# Patient Record
Sex: Female | Born: 1955 | Race: White | Hispanic: No | Marital: Married | Smoking: Current every day smoker
Health system: Southern US, Community
[De-identification: ages and names within clinical notes are randomized; demographics above are authoritative.]

## PROBLEM LIST (undated history)

## (undated) DIAGNOSIS — R238 Other skin changes: Secondary | ICD-10-CM

## (undated) DIAGNOSIS — M810 Age-related osteoporosis without current pathological fracture: Secondary | ICD-10-CM

## (undated) DIAGNOSIS — F319 Bipolar disorder, unspecified: Secondary | ICD-10-CM

## (undated) DIAGNOSIS — R233 Spontaneous ecchymoses: Secondary | ICD-10-CM

## (undated) DIAGNOSIS — J189 Pneumonia, unspecified organism: Secondary | ICD-10-CM

## (undated) DIAGNOSIS — F32A Depression, unspecified: Secondary | ICD-10-CM

## (undated) DIAGNOSIS — F329 Major depressive disorder, single episode, unspecified: Secondary | ICD-10-CM

## (undated) DIAGNOSIS — E039 Hypothyroidism, unspecified: Secondary | ICD-10-CM

## (undated) DIAGNOSIS — M1990 Unspecified osteoarthritis, unspecified site: Secondary | ICD-10-CM

## (undated) DIAGNOSIS — G43909 Migraine, unspecified, not intractable, without status migrainosus: Secondary | ICD-10-CM

## (undated) DIAGNOSIS — K219 Gastro-esophageal reflux disease without esophagitis: Secondary | ICD-10-CM

## (undated) HISTORY — PX: DILATION AND CURETTAGE OF UTERUS: SHX78

## (undated) HISTORY — PX: COLONOSCOPY: SHX174

## (undated) HISTORY — PX: ORIF ANKLE FRACTURE: SUR919

---
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
## 1980-06-22 HISTORY — PX: KNEE ARTHROSCOPY: SUR90

## 1983-06-23 HISTORY — PX: CARPAL TUNNEL RELEASE: SHX101

## 1990-06-22 DIAGNOSIS — J189 Pneumonia, unspecified organism: Secondary | ICD-10-CM

## 1990-06-22 HISTORY — DX: Pneumonia, unspecified organism: J18.9

## 1997-10-31 ENCOUNTER — Inpatient Hospital Stay (HOSPITAL_COMMUNITY): Admission: EM | Admit: 1997-10-31 | Discharge: 1997-11-06 | Payer: Self-pay | Admitting: Emergency Medicine

## 2002-08-18 ENCOUNTER — Encounter: Payer: Self-pay | Admitting: *Deleted

## 2002-08-18 ENCOUNTER — Encounter: Payer: Self-pay | Admitting: Orthopedic Surgery

## 2002-08-18 ENCOUNTER — Emergency Department (HOSPITAL_COMMUNITY): Admission: EM | Admit: 2002-08-18 | Discharge: 2002-08-18 | Payer: Self-pay | Admitting: *Deleted

## 2002-08-19 ENCOUNTER — Encounter: Payer: Self-pay | Admitting: Orthopedic Surgery

## 2002-08-19 ENCOUNTER — Inpatient Hospital Stay (HOSPITAL_COMMUNITY): Admission: RE | Admit: 2002-08-19 | Discharge: 2002-08-20 | Payer: Self-pay | Admitting: Orthopedic Surgery

## 2004-06-06 ENCOUNTER — Ambulatory Visit: Payer: Self-pay | Admitting: Cardiology

## 2009-09-20 ENCOUNTER — Ambulatory Visit (HOSPITAL_COMMUNITY): Admission: RE | Admit: 2009-09-20 | Discharge: 2009-09-20 | Payer: Self-pay | Admitting: Orthopedic Surgery

## 2009-09-20 ENCOUNTER — Ambulatory Visit: Payer: Self-pay | Admitting: Vascular Surgery

## 2009-09-20 ENCOUNTER — Encounter (INDEPENDENT_AMBULATORY_CARE_PROVIDER_SITE_OTHER): Payer: Self-pay | Admitting: Orthopedic Surgery

## 2010-11-07 NOTE — Op Note (Signed)
NAME:  [name], [name]                            ACCOUNT NO.:  [id number]   MEDICAL RECORD NO.:  [id number]                   PATIENT TYPE:  INP   LOCATION:  0101                                 FACILITY:  [facility]   PHYSICIAN:  Marlowe Kays, M.D.               DATE OF BIRTH:  [birth year]   DATE OF PROCEDURE:  08/19/2002  DATE OF DISCHARGE:                                 OPERATIVE REPORT   PREOPERATIVE DIAGNOSES:  Closed displaced posterior malleolar fracture.  Bimalleolar fracture, left ankle.   POSTOPERATIVE DIAGNOSES:  Closed displaced posterior malleolar fracture.  Bimalleolar fracture, left ankle.   OPERATION:  Open reduction internal fixation, posterior malleolar fracture -  left ankle.   SURGEON:  Marlowe Kays, M.D.   ASSISTANT:  Clarene Reamer, P.A.-C.   ANESTHESIA:  General.   INDICATIONS FOR PROCEDURE:  Injury was two nights ago and she was seen here  in the emergency room early yesterday morning, and in my office yesterday  afternoon.  She had spiral fracture of the distal fibula, which was in  satisfactory position; but, the large posterior malleolar fracture  comprising about 3/7 of the articular surface of the joint, with about 3 mm  of displacement and it was not felt that this was satisfactory.  The plan  today was to try to close manipulation, which I had told the patient and her  husband would most likely not be successful because of the nature of the  fracture, with probable ORIF.   DESCRIPTION OF PROCEDURE:  Under satisfactory general anesthesia, she was  placed in the prone position initially and with dorsiflexion of the ankle  inversion, I manipulated the fracture.  Using a C-arm found out that the  fracture was still significantly displaced.  Accordingly, we then prepared  her for ORIF.  She is given appropriate prophylactic antibiotics.  A  pneumatic tourniquet was applied.  She was then placed in the prone position  on rolls.  The leg was wrapped  in Esmarch nonsterilely, and then prepped  with Duraprep from the mid calf to the toes; draped in a sterile field.  We  made an incision just medial to the Achilles' tendon, identified the  paracalcaneal joint.  Moving proximally, the tibial talar joint was  identified; then continued dissection proximally until the fracture was  identified.  Because of the height of the fracture, I was not able to really  see the fracture line; but, in spite we tractioned and accordingly I used a  periosteal elevator to pull down the posterior malleolar fragment as close  as I could get it to the talus.  I then placed a smooth Steinmann pin,  stabilizing it into the main portion of the tibia.  C-arm x-rays appeared to  demonstrate reconstitution of the joint nicely, with the fracture to be in  good position.  Accordingly, I placed two guide pins for cannulated  screws  above and below the stabilizing pin, and checked their position on x-ray;  measured them and used 34 and 32 mm cannulated 4.5 screws.  X-rays again  appeared to indicate that we had anatomically reduced the ankle mortis and  the fracture.  I then removed the Steinmann pin, placed a guide pin in the  hole, and used a third 4.5 cannulated fully-threaded screw.  Final pictures  were taken, both in the lateral and in the posterior anterior positions.  The three screws appeared to be in good position, the fracture and mortis  reduced, and the distal fibula fracture appeared also to be in satisfactory  position (although this was an AP x-ray).   I then irrigated the wound well with sterile saline, infiltrated the soft  tissues with 0.5% plain Marcaine, and closed the wound with interrupted 2-0  Vicryl in the posterior ankle capsule and subcutaneous tissue.  Used  interrupted 4-0  nylon in the skin and subcutaneous tissue.  Betadine and Adaptic dry sterile  dressing, and a short-leg splint cast were applied.  The tourniquet was  released.  She  tolerated the procedure well and was taken to the recovery  room in satisfactory condition with normal complications.  Permanent AP and  lateral x-rays will be taken in the PACU.                                               Marlowe Kays, M.D.    JA/MEDQ  D:  08/19/2002  T:  08/19/2002  Job:  [id number]

## 2013-03-16 ENCOUNTER — Encounter (HOSPITAL_COMMUNITY): Payer: Self-pay | Admitting: Pharmacy Technician

## 2013-03-17 ENCOUNTER — Encounter (HOSPITAL_COMMUNITY)
Admission: RE | Admit: 2013-03-17 | Discharge: 2013-03-17 | Disposition: A | Discharge status: Home / Self Care | Payer: 59 | Source: Ambulatory Visit | Attending: Orthopaedic Surgery | Admitting: Orthopaedic Surgery

## 2013-03-17 ENCOUNTER — Encounter (HOSPITAL_COMMUNITY): Payer: Self-pay

## 2013-03-17 ENCOUNTER — Encounter (HOSPITAL_COMMUNITY)
Admission: RE | Admit: 2013-03-17 | Discharge: 2013-03-17 | Disposition: A | Discharge status: Home / Self Care | Payer: 59 | Source: Ambulatory Visit | Attending: Orthopedic Surgery | Admitting: Orthopedic Surgery

## 2013-03-17 DIAGNOSIS — Z01818 Encounter for other preprocedural examination: Secondary | ICD-10-CM | POA: Insufficient documentation

## 2013-03-17 DIAGNOSIS — Z01812 Encounter for preprocedural laboratory examination: Secondary | ICD-10-CM | POA: Insufficient documentation

## 2013-03-17 HISTORY — DX: Hypothyroidism, unspecified: E03.9

## 2013-03-17 HISTORY — DX: Unspecified osteoarthritis, unspecified site: M19.90

## 2013-03-17 HISTORY — DX: Depression, unspecified: F32.A

## 2013-03-17 HISTORY — DX: Gastro-esophageal reflux disease without esophagitis: K21.9

## 2013-03-17 HISTORY — DX: Pneumonia, unspecified organism: J18.9

## 2013-03-17 HISTORY — DX: Bipolar disorder, unspecified: F31.9

## 2013-03-17 HISTORY — DX: Major depressive disorder, single episode, unspecified: F32.9

## 2013-03-17 HISTORY — DX: Migraine, unspecified, not intractable, without status migrainosus: G43.909

## 2013-03-17 HISTORY — DX: Age-related osteoporosis without current pathological fracture: M81.0

## 2013-03-17 LAB — COMPREHENSIVE METABOLIC PANEL
AST: 12 U/L (ref 0–37)
CO2: 24 mEq/L (ref 19–32)
Calcium: 9.5 mg/dL (ref 8.4–10.5)
Creatinine, Ser: 0.66 mg/dL (ref 0.50–1.10)
GFR calc Af Amer: >90 mL/min (ref >90)
GFR calc non Af Amer: >90 mL/min (ref >90)
Glucose, Bld: 96 mg/dL (ref 70–99)
Total Protein: 7.2 g/dL (ref 6.0–8.3)

## 2013-03-17 LAB — URINALYSIS, ROUTINE W REFLEX MICROSCOPIC
Bilirubin Urine: NEGATIVE
Glucose, UA: NEGATIVE mg/dL
Hgb urine dipstick: NEGATIVE
Ketones, ur: NEGATIVE mg/dL
Protein, ur: NEGATIVE mg/dL

## 2013-03-17 LAB — CBC
MCH: 30.7 pg (ref 26.0–34.0)
MCHC: 33.9 g/dL (ref 30.0–36.0)
MCV: 90.6 fL (ref 78.0–100.0)
Platelets: 242 10*3/uL (ref 150–400)
RBC: 4.56 MIL/uL (ref 3.87–5.11)

## 2013-03-17 LAB — PROTIME-INR: INR: 0.91 (ref 0.00–1.49)

## 2013-03-17 LAB — SURGICAL PCR SCREEN: MRSA, PCR: NEGATIVE

## 2013-03-17 LAB — ABO/RH: ABO/RH(D): A POS

## 2013-03-17 LAB — TYPE AND SCREEN: ABO/RH(D): A POS

## 2013-03-17 NOTE — Progress Notes (Signed)
Patient brought a recent EKG and Nurse gave to [place], [place] to review for acceptance. Revonda Standard, [place] stated that the EKG was sufficient and another EKG did not need to be obtained today during PAT visit. EKG placed on chart under cardiology tab.

## 2013-03-17 NOTE — Pre-Procedure Instructions (Signed)
[name]  03/17/2013   Your procedure is scheduled on:  Tuesday March 28, 2013.  Report to [facility] Short Stay Entrance "A" at 5:30 AM.  Call this number if you have problems the morning of surgery: [phone]   Remember:   Do not eat food or drink liquids after midnight.   Take these medicines the morning of surgery with A SIP OF WATER: Acetaminophen (Tylenol ) if needed for pain, Dexlansoprazole (Dexilant), Levothyroxine (Synthroid), Sertraline (Zoloft) and Litjium   Do not wear jewelry, make-up or nail polish.  Do not wear lotions, powders, or perfumes. You may wear deodorant.  Do not shave 48 hours prior to surgery.   Do not bring valuables to the hospital.  [facility] is not responsible for any belongings or valuables.               Contacts, dentures or bridgework may not be worn into surgery.  Leave suitcase in the car. After surgery it may be brought to your room.  For patients admitted to the hospital, discharge time is determined by your reatment team.               Patients discharged the day of surgery will not be allowed to drive home.  Name and phone number of your driver: Family/Friend  Special Instructions: Shower using CHG 2 nights before surgery and the night before surgery.  If you shower the day of surgery use CHG.  Use special wash - you have one bottle of CHG for all showers.  You should use approximately 1/3 of the bottle for each shower.   Please read over the following fact sheets that you were given: Pain Booklet, Coughing and Deep Breathing, Blood Transfusion Information, MRSA Information and Surgical Site Infection Prevention

## 2013-03-17 NOTE — Progress Notes (Signed)
Patient denied having any cardiac or pulmonary issues. PCP is Dr. Samuel Jester in [place].

## 2013-03-18 LAB — URINE CULTURE

## 2013-03-23 NOTE — H&P (Signed)
CHIEF COMPLAINT:  Painful right knee.   HISTORY OF PRESENT ILLNESS:  [name] is a very pleasant 57 year old white female who is seen today for evaluation of her right knee.  We had seen her approximately 1 year ago for evaluation of her right knee.  She has had previous surgeries in 1981 where she has had 3 scars placed on her knee, that of 1 medial, 1 lateral and 1 mid medial aspect.  She apparently has had a meniscectomy performed.  She was starting to have some catching and problems when she was walking and pain on the medial joint line.  At that time it was chronically worsening and was quite moderately severe and was also having catching when she went into flexion.  She tried ibuprofen and Aleve without much benefit.  At that time she underwent a corticosteroid injection.  She had actually done well until June of 2014.  She re-experienced her symptoms again.  At that time she received a corticosteroid injection by Dr. Cleophas Jones.  The injection was very minimally beneficial.  She is starting to have to place a pillow between her legs and started having difficulty again with locking at times and significant pain and discomfort.  She is now starting to lack motion secondary to her pain.  A discussion of total joint replacement was had at that time, but she would like to try a Hyalgan injections series.  We did have 1 injection given back on July 9 and apparently then the insurance decided not to pay for the injection so she could not have that performed.  She has continued to have increasing pain and discomfort and now also is having problems with motion both flexion and extension.  Her pain is now pretty much on a daily basis.  Her bad days out number her good days.  She does have some swelling in the knee that does bother her.  She is also having mechanical symptoms.  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  She has gotten to the point now where she cannot  stand the pain, is having nighttime pain.  Seen today for evaluation.   HEALTH HISTORY:  In general her health is good.   HOSPITALIZATIONS:  1974 for appendectomy, 1976 D&C, 1976 right salpingo-oophorectomy D&C.  1981 right knee meniscectomy.  November 1985 right carpal tunnel release.  October 1996 D&C.  February 2004 for ORIF left bimalleolar fracture.   HOSPITALIZATIONS WITHOUT SURGERY:  1992 for pneumonia.  October 1986 childbirth.   CURRENT MEDICATIONS:   Dexilant 60 mg 1 daily.  Lithium carbonate 200 mg b.i.d.  Zoloft 50 mg daily.  Synthroid 50 mg daily.  Lamisil 250 mg daily.     ALLERGIES:   Codeine, Percocet, Vicodin, Demerol which causes swelling and itching.   REVIEW OF SYSTEMS:   A 14-point review of systems is negative except for history of pneumonia in 2000 with pleurisy.  She does have bronchitis mainly on a yearly basis because of previous cigarette smoking.  She is hypothyroid.  She does have atypical migraines.  She is on Lithium for depression.   FAMILY HISTORY:   Positive for mother who is still alive at 65 and has had seizures in the past.  Father who died at 45 from COPD.  He also had colon cancer.  She has no brothers.  She has 1 sister at 99 with a history of hypertension, diabetes.     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  She smokes 1 pack of cigarettes per day for 39 years, but stopped on 02/20/2013.   PHYSICAL EXAMINATION:  A 57 year old white female, well-developed, well-nourished, alert, pleasant, and cooperative in moderate distress secondary to right knee pain.   Height is 5 feet 6 inches.  Weight 159 pounds.  BMI is 25.7.     Vital signs reveal temperature 98.2, pulse 68, respirations 16, blood pressure 128/79.   Her head is normocephalic.   Eyes:  Pupils round, reactive to light and accommodation.  Ears, nose and throat were benign. Neck was supple, no bruits.   Chest had good  expansion. Lungs:  Decreased breath sounds that were clear.   Cardiac had a regular rhythm and rate.  Normal S1, S2, no murmurs. Pulses were 1+ bilateral and symmetric in the lower extremities. Abdomen was scaphoid soft and nontender, no mass is palpable.  Normal bowel sounds are present. CNS:  She is oriented x3 and cranial nerves II-XII grossly intact.  Genital:  Rectal and breast exams were not indicated for a orthopedic evaluation.   Musculoskeletal:   Today she has range of motion from about 15 degrees to 105 degrees.  She has a mild effusion.  Pseudolaxity with varus and valgus stressing.  Calf is supple and nontender.  She does have medial and lateral joint line pain more medial than lateral.  She has good motion of both hips.   RADIOGRAPHS:  Reveals bone-on-bone medial compartment OA with periarticular spurring medially.  Peaked tibial spines.  There is some patellofemoral OA also noted.  She is maintaining a good joint space on her lateral at this time.  She does have sclerosing and cystic changes in the distal medial femoral condyle and the proximal medial tibial plateau.     CLINICAL IMPRESSION:   1.  End-stage OA of the right knee, loose bi-compartment, possible tricompartment. 2.  Recent sensation of cigarette smoking with a 39 pack year history. 3.  Hypothyroidism. 4.  Atypical migraines.   5.  Depression on Lithium and Zoloft. 6.  Osteopenia/osteoporosis.   RECOMMENDATIONS:    At this time we discussed conservative versus operative treatment.  She would like to consider having a total knee replacement because of her symptoms that are now worsening and affecting her daily life.  The procedure, risks and benefits have been fully explained to her and she is understanding.  I have reviewed a form from Dr. Charm Jones who has cleared [name] for surgery from a medical and cardiac standpoint.  Therefore we will proceed in the very near future with a right total knee arthroplasty.  [name]  [name] [facility] [phone]  03/23/2013 2:35 PM

## 2013-03-27 MED ORDER — CEFAZOLIN SODIUM-DEXTROSE 2-3 GM-% IV SOLR
2.0000 g | INTRAVENOUS | Status: AC
  Administered 2013-03-28: 2 g via INTRAVENOUS
  Filled 2013-03-27: qty 50

## 2013-03-28 ENCOUNTER — Encounter (HOSPITAL_COMMUNITY)
Admission: RE | Disposition: A | Discharge status: Home Health | Payer: Self-pay | Source: Ambulatory Visit | Attending: Orthopaedic Surgery

## 2013-03-28 ENCOUNTER — Inpatient Hospital Stay (HOSPITAL_COMMUNITY): Payer: 59 | Admitting: Anesthesiology

## 2013-03-28 ENCOUNTER — Encounter (HOSPITAL_COMMUNITY): Payer: Self-pay | Admitting: Anesthesiology

## 2013-03-28 ENCOUNTER — Inpatient Hospital Stay (HOSPITAL_COMMUNITY)
Admission: RE | Admit: 2013-03-28 | Discharge: 2013-03-30 | DRG: 470 | Disposition: A | Discharge status: Home Health | Payer: 59 | Source: Ambulatory Visit | Attending: Orthopaedic Surgery | Admitting: Orthopaedic Surgery

## 2013-03-28 DIAGNOSIS — M1711 Unilateral primary osteoarthritis, right knee: Secondary | ICD-10-CM | POA: Diagnosis present

## 2013-03-28 DIAGNOSIS — Z7901 Long term (current) use of anticoagulants: Secondary | ICD-10-CM

## 2013-03-28 DIAGNOSIS — M899 Disorder of bone, unspecified: Secondary | ICD-10-CM | POA: Diagnosis present

## 2013-03-28 DIAGNOSIS — Z800 Family history of malignant neoplasm of digestive organs: Secondary | ICD-10-CM

## 2013-03-28 DIAGNOSIS — G43809 Other migraine, not intractable, without status migrainosus: Secondary | ICD-10-CM | POA: Diagnosis present

## 2013-03-28 DIAGNOSIS — Z23 Encounter for immunization: Secondary | ICD-10-CM

## 2013-03-28 DIAGNOSIS — K219 Gastro-esophageal reflux disease without esophagitis: Secondary | ICD-10-CM | POA: Diagnosis present

## 2013-03-28 DIAGNOSIS — Z79899 Other long term (current) drug therapy: Secondary | ICD-10-CM

## 2013-03-28 DIAGNOSIS — Z833 Family history of diabetes mellitus: Secondary | ICD-10-CM

## 2013-03-28 DIAGNOSIS — E039 Hypothyroidism, unspecified: Secondary | ICD-10-CM | POA: Diagnosis present

## 2013-03-28 DIAGNOSIS — M810 Age-related osteoporosis without current pathological fracture: Secondary | ICD-10-CM | POA: Diagnosis present

## 2013-03-28 DIAGNOSIS — Z9089 Acquired absence of other organs: Secondary | ICD-10-CM

## 2013-03-28 DIAGNOSIS — M1710 Unilateral primary osteoarthritis, unspecified knee: Principal | ICD-10-CM | POA: Diagnosis present

## 2013-03-28 DIAGNOSIS — Z8249 Family history of ischemic heart disease and other diseases of the circulatory system: Secondary | ICD-10-CM

## 2013-03-28 DIAGNOSIS — F319 Bipolar disorder, unspecified: Secondary | ICD-10-CM | POA: Diagnosis present

## 2013-03-28 DIAGNOSIS — Z87891 Personal history of nicotine dependence: Secondary | ICD-10-CM

## 2013-03-28 HISTORY — PX: TOTAL KNEE ARTHROPLASTY: SHX125

## 2013-03-28 SURGERY — ARTHROPLASTY, KNEE, TOTAL
Anesthesia: General | Site: Knee | Laterality: Right | Wound class: Clean

## 2013-03-28 MED ORDER — HYDROMORPHONE HCL PF 1 MG/ML IJ SOLN
0.2500 mg | INTRAMUSCULAR | Status: DC | PRN
  Administered 2013-03-28 (×2): 0.5 mg via INTRAVENOUS

## 2013-03-28 MED ORDER — ONDANSETRON HCL 4 MG/2ML IJ SOLN: 4.0000 mg | Freq: Four times a day (QID) | INTRAMUSCULAR | Status: DC | PRN

## 2013-03-28 MED ORDER — SERTRALINE HCL 50 MG PO TABS
50.0000 mg | ORAL_TABLET | Freq: Every morning | ORAL | Status: DC
  Administered 2013-03-29 – 2013-03-30 (×2): 50 mg via ORAL
  Filled 2013-03-28 (×2): qty 1

## 2013-03-28 MED ORDER — OXYCODONE HCL 5 MG PO TABS
ORAL_TABLET | ORAL | Status: AC
  Filled 2013-03-28: qty 2

## 2013-03-28 MED ORDER — METOCLOPRAMIDE HCL 10 MG PO TABS: 5.0000 mg | ORAL_TABLET | Freq: Three times a day (TID) | ORAL | Status: DC | PRN

## 2013-03-28 MED ORDER — HYDROMORPHONE HCL PF 1 MG/ML IJ SOLN
INTRAMUSCULAR | Status: AC
  Administered 2013-03-28: 0.5 mg via INTRAVENOUS
  Filled 2013-03-28: qty 1

## 2013-03-28 MED ORDER — DOCUSATE SODIUM 100 MG PO CAPS
100.0000 mg | ORAL_CAPSULE | Freq: Two times a day (BID) | ORAL | Status: DC
  Administered 2013-03-28 – 2013-03-30 (×4): 100 mg via ORAL
  Filled 2013-03-28 (×4): qty 1

## 2013-03-28 MED ORDER — LEVOTHYROXINE SODIUM 50 MCG PO TABS
50.0000 ug | ORAL_TABLET | Freq: Every day | ORAL | Status: DC
  Administered 2013-03-29 – 2013-03-30 (×2): 50 ug via ORAL
  Filled 2013-03-28 (×3): qty 1

## 2013-03-28 MED ORDER — METOCLOPRAMIDE HCL 5 MG/ML IJ SOLN: 5.0000 mg | Freq: Three times a day (TID) | INTRAMUSCULAR | Status: DC | PRN

## 2013-03-28 MED ORDER — ONDANSETRON HCL 4 MG/2ML IJ SOLN
INTRAMUSCULAR | Status: DC | PRN
  Administered 2013-03-28: 4 mg via INTRAMUSCULAR

## 2013-03-28 MED ORDER — FENTANYL CITRATE 0.05 MG/ML IJ SOLN
INTRAMUSCULAR | Status: DC | PRN
  Administered 2013-03-28 (×4): 50 ug via INTRAVENOUS
  Administered 2013-03-28: 100 ug via INTRAVENOUS
  Administered 2013-03-28: 50 ug via INTRAVENOUS

## 2013-03-28 MED ORDER — SODIUM CHLORIDE 0.9 % IR SOLN
Status: DC | PRN
  Administered 2013-03-28: 1000 mL
  Administered 2013-03-28: 3000 mL

## 2013-03-28 MED ORDER — ACETAMINOPHEN 500 MG PO TABS: 1000.0000 mg | ORAL_TABLET | Freq: Once | ORAL | Status: DC

## 2013-03-28 MED ORDER — DEXAMETHASONE SODIUM PHOSPHATE 4 MG/ML IJ SOLN
INTRAMUSCULAR | Status: DC | PRN
  Administered 2013-03-28: 8 mg via INTRAVENOUS

## 2013-03-28 MED ORDER — ONDANSETRON HCL 4 MG PO TABS
4.0000 mg | ORAL_TABLET | Freq: Four times a day (QID) | ORAL | Status: DC | PRN
  Administered 2013-03-29 – 2013-03-30 (×2): 4 mg via ORAL
  Filled 2013-03-28 (×2): qty 1

## 2013-03-28 MED ORDER — CHLORHEXIDINE GLUCONATE 4 % EX LIQD: 60.0000 mL | Freq: Every day | CUTANEOUS | Status: DC

## 2013-03-28 MED ORDER — SODIUM CHLORIDE 0.9 % IV SOLN
75.0000 mL/h | INTRAVENOUS | Status: DC
Start: 2013-03-28 — End: 2013-03-30
  Administered 2013-03-28: 75 mL/h via INTRAVENOUS

## 2013-03-28 MED ORDER — METHOCARBAMOL 500 MG PO TABS
ORAL_TABLET | ORAL | Status: AC
  Administered 2013-03-28: 500 mg via ORAL
  Filled 2013-03-28: qty 1

## 2013-03-28 MED ORDER — MEPERIDINE HCL 25 MG/ML IJ SOLN: 6.2500 mg | INTRAMUSCULAR | Status: DC | PRN

## 2013-03-28 MED ORDER — MIDAZOLAM HCL 5 MG/5ML IJ SOLN
INTRAMUSCULAR | Status: DC | PRN
  Administered 2013-03-28: 2 mg via INTRAVENOUS

## 2013-03-28 MED ORDER — OXYCODONE HCL 5 MG/5ML PO SOLN: 5.0000 mg | Freq: Once | ORAL | Status: DC | PRN

## 2013-03-28 MED ORDER — LITHIUM CARBONATE 300 MG PO CAPS
300.0000 mg | ORAL_CAPSULE | Freq: Two times a day (BID) | ORAL | Status: DC
  Administered 2013-03-28 – 2013-03-30 (×4): 300 mg via ORAL
  Filled 2013-03-28 (×7): qty 1

## 2013-03-28 MED ORDER — BUPIVACAINE-EPINEPHRINE PF 0.25-1:200000 % IJ SOLN
INTRAMUSCULAR | Status: AC
  Filled 2013-03-28: qty 30

## 2013-03-28 MED ORDER — INFLUENZA VAC SPLIT QUAD 0.5 ML IM SUSP
0.5000 mL | INTRAMUSCULAR | Status: AC
  Filled 2013-03-28: qty 0.5

## 2013-03-28 MED ORDER — BISACODYL 10 MG RE SUPP: 10.0000 mg | Freq: Every day | RECTAL | Status: DC | PRN

## 2013-03-28 MED ORDER — CHLORHEXIDINE GLUCONATE 4 % EX LIQD: 60.0000 mL | Freq: Once | CUTANEOUS | Status: DC

## 2013-03-28 MED ORDER — SODIUM CHLORIDE 0.9 % IV SOLN: INTRAVENOUS | Status: DC

## 2013-03-28 MED ORDER — METHOCARBAMOL 100 MG/ML IJ SOLN
500.0000 mg | Freq: Four times a day (QID) | INTRAVENOUS | Status: DC | PRN
  Filled 2013-03-28: qty 5

## 2013-03-28 MED ORDER — ALUM & MAG HYDROXIDE-SIMETH 200-200-20 MG/5ML PO SUSP: 30.0000 mL | ORAL | Status: DC | PRN

## 2013-03-28 MED ORDER — PROPOFOL 10 MG/ML IV BOLUS
INTRAVENOUS | Status: DC | PRN
  Administered 2013-03-28: 150 mg via INTRAVENOUS

## 2013-03-28 MED ORDER — LIDOCAINE HCL (CARDIAC) 20 MG/ML IV SOLN
INTRAVENOUS | Status: DC | PRN
  Administered 2013-03-28: 100 mg via INTRAVENOUS

## 2013-03-28 MED ORDER — OXYCODONE HCL 5 MG PO TABS: 5.0000 mg | ORAL_TABLET | ORAL | Status: DC | PRN

## 2013-03-28 MED ORDER — LACTATED RINGERS IV SOLN
INTRAVENOUS | Status: DC | PRN
  Administered 2013-03-28 (×2): via INTRAVENOUS

## 2013-03-28 MED ORDER — RIVAROXABAN 10 MG PO TABS
10.0000 mg | ORAL_TABLET | Freq: Every day | ORAL | Status: DC
  Administered 2013-03-28 – 2013-03-29 (×2): 10 mg via ORAL
  Filled 2013-03-28 (×3): qty 1

## 2013-03-28 MED ORDER — METHOCARBAMOL 500 MG PO TABS
500.0000 mg | ORAL_TABLET | Freq: Four times a day (QID) | ORAL | Status: DC | PRN
  Administered 2013-03-28 – 2013-03-30 (×4): 500 mg via ORAL
  Filled 2013-03-28 (×3): qty 1

## 2013-03-28 MED ORDER — ACETAMINOPHEN 650 MG RE SUPP: 650.0000 mg | Freq: Four times a day (QID) | RECTAL | Status: DC | PRN

## 2013-03-28 MED ORDER — PHENOL 1.4 % MT LIQD: 1.0000 | OROMUCOSAL | Status: DC | PRN

## 2013-03-28 MED ORDER — ONDANSETRON HCL 4 MG/2ML IJ SOLN: 4.0000 mg | Freq: Once | INTRAMUSCULAR | Status: DC | PRN

## 2013-03-28 MED ORDER — MENTHOL 3 MG MT LOZG: 1.0000 | LOZENGE | OROMUCOSAL | Status: DC | PRN

## 2013-03-28 MED ORDER — CEFAZOLIN SODIUM-DEXTROSE 2-3 GM-% IV SOLR
2.0000 g | Freq: Four times a day (QID) | INTRAVENOUS | Status: AC
  Administered 2013-03-28 (×2): 2 g via INTRAVENOUS
  Filled 2013-03-28 (×2): qty 50

## 2013-03-28 MED ORDER — BUPIVACAINE-EPINEPHRINE 0.25% -1:200000 IJ SOLN
INTRAMUSCULAR | Status: DC | PRN
  Administered 2013-03-28: 30 mL

## 2013-03-28 MED ORDER — ACETAMINOPHEN 325 MG PO TABS
650.0000 mg | ORAL_TABLET | Freq: Four times a day (QID) | ORAL | Status: DC | PRN
  Administered 2013-03-29: 650 mg via ORAL
  Filled 2013-03-28: qty 2

## 2013-03-28 MED ORDER — HYDROMORPHONE HCL PF 1 MG/ML IJ SOLN
0.5000 mg | INTRAMUSCULAR | Status: DC | PRN
  Administered 2013-03-28 – 2013-03-29 (×2): 1 mg via INTRAVENOUS
  Filled 2013-03-28 (×2): qty 1

## 2013-03-28 MED ORDER — PANTOPRAZOLE SODIUM 40 MG PO TBEC
40.0000 mg | DELAYED_RELEASE_TABLET | Freq: Every day | ORAL | Status: DC
  Administered 2013-03-29 – 2013-03-30 (×2): 40 mg via ORAL
  Filled 2013-03-28 (×2): qty 1

## 2013-03-28 MED ORDER — FLEET ENEMA 7-19 GM/118ML RE ENEM: 1.0000 | ENEMA | Freq: Once | RECTAL | Status: AC | PRN

## 2013-03-28 MED ORDER — MAGNESIUM HYDROXIDE 400 MG/5ML PO SUSP: 30.0000 mL | Freq: Every day | ORAL | Status: DC | PRN

## 2013-03-28 MED ORDER — VARENICLINE TARTRATE 1 MG PO TABS
1.0000 mg | ORAL_TABLET | Freq: Two times a day (BID) | ORAL | Status: DC
  Administered 2013-03-28 – 2013-03-30 (×4): 1 mg via ORAL
  Filled 2013-03-28 (×5): qty 1

## 2013-03-28 MED ORDER — KETOROLAC TROMETHAMINE 15 MG/ML IJ SOLN
15.0000 mg | Freq: Four times a day (QID) | INTRAMUSCULAR | Status: AC
  Administered 2013-03-28 (×2): 15 mg via INTRAVENOUS
  Filled 2013-03-28 (×2): qty 1

## 2013-03-28 MED ORDER — OXYCODONE HCL 5 MG PO TABS: 5.0000 mg | ORAL_TABLET | Freq: Once | ORAL | Status: DC | PRN

## 2013-03-28 MED ORDER — PNEUMOCOCCAL VAC POLYVALENT 25 MCG/0.5ML IJ INJ
0.5000 mL | INJECTION | INTRAMUSCULAR | Status: AC
  Filled 2013-03-28: qty 0.5

## 2013-03-28 MED ORDER — KETOROLAC TROMETHAMINE 30 MG/ML IJ SOLN
INTRAMUSCULAR | Status: DC | PRN
  Administered 2013-03-28: 30 mg via INTRAVENOUS

## 2013-03-28 SURGICAL SUPPLY — 64 items
BANDAGE ESMARK 6X9 LF (GAUZE/BANDAGES/DRESSINGS) ×1 IMPLANT
BLADE SAGITTAL 25.0X1.19X90 (BLADE) ×2 IMPLANT
BNDG ESMARK 6X9 LF (GAUZE/BANDAGES/DRESSINGS) ×2
BOWL SMART MIX CTS (DISPOSABLE) ×2 IMPLANT
CAPT RP KNEE ×2 IMPLANT
CEMENT HV SMART SET (Cement) ×4 IMPLANT
CLOTH BEACON ORANGE TIMEOUT ST (SAFETY) IMPLANT
COVER BACK TABLE 24X17X13 BIG (DRAPES) IMPLANT
COVER SURGICAL LIGHT HANDLE (MISCELLANEOUS) ×2 IMPLANT
CUFF TOURNIQUET SINGLE 34IN LL (TOURNIQUET CUFF) ×2 IMPLANT
CUFF TOURNIQUET SINGLE 44IN (TOURNIQUET CUFF) IMPLANT
DRAPE EXTREMITY T 121X128X90 (DRAPE) ×2 IMPLANT
DRAPE PROXIMA HALF (DRAPES) ×2 IMPLANT
DRSG ADAPTIC 3X8 NADH LF (GAUZE/BANDAGES/DRESSINGS) ×2 IMPLANT
DRSG PAD ABDOMINAL 8X10 ST (GAUZE/BANDAGES/DRESSINGS) ×2 IMPLANT
DURAPREP 26ML APPLICATOR (WOUND CARE) ×2 IMPLANT
ELECT CAUTERY BLADE 6.4 (BLADE) ×2 IMPLANT
ELECT REM PT RETURN 9FT ADLT (ELECTROSURGICAL) ×2
ELECTRODE REM PT RTRN 9FT ADLT (ELECTROSURGICAL) ×1 IMPLANT
EVACUATOR 1/8 PVC DRAIN (DRAIN) ×2 IMPLANT
FACESHIELD LNG OPTICON STERILE (SAFETY) ×4 IMPLANT
FLOSEAL 10ML (HEMOSTASIS) IMPLANT
GLOVE BIO SURGEON STRL SZ7 (GLOVE) ×4 IMPLANT
GLOVE BIOGEL PI IND STRL 7.0 (GLOVE) ×1 IMPLANT
GLOVE BIOGEL PI IND STRL 8 (GLOVE) ×1 IMPLANT
GLOVE BIOGEL PI IND STRL 8.5 (GLOVE) ×1 IMPLANT
GLOVE BIOGEL PI INDICATOR 7.0 (GLOVE) ×1
GLOVE BIOGEL PI INDICATOR 8 (GLOVE) ×1
GLOVE BIOGEL PI INDICATOR 8.5 (GLOVE) ×1
GLOVE ECLIPSE 8.0 STRL XLNG CF (GLOVE) ×4 IMPLANT
GLOVE SURG ORTHO 8.5 STRL (GLOVE) ×6 IMPLANT
GLOVE SURG SS PI 8.5 STRL IVOR (GLOVE) ×1
GLOVE SURG SS PI 8.5 STRL STRW (GLOVE) ×1 IMPLANT
GOWN PREVENTION PLUS XXLARGE (GOWN DISPOSABLE) ×2 IMPLANT
GOWN STRL NON-REIN LRG LVL3 (GOWN DISPOSABLE) ×2 IMPLANT
GOWN STRL REIN 2XL XLG LVL4 (GOWN DISPOSABLE) ×2 IMPLANT
HANDPIECE INTERPULSE COAX TIP (DISPOSABLE) ×1
KIT BASIN OR (CUSTOM PROCEDURE TRAY) ×2 IMPLANT
KIT ROOM TURNOVER OR (KITS) ×2 IMPLANT
MANIFOLD NEPTUNE II (INSTRUMENTS) ×2 IMPLANT
NEEDLE 22X1 1/2 (OR ONLY) (NEEDLE) ×2 IMPLANT
NS IRRIG 1000ML POUR BTL (IV SOLUTION) ×2 IMPLANT
PACK TOTAL JOINT (CUSTOM PROCEDURE TRAY) ×2 IMPLANT
PAD ARMBOARD 7.5X6 YLW CONV (MISCELLANEOUS) ×2 IMPLANT
PAD CAST 4YDX4 CTTN HI CHSV (CAST SUPPLIES) ×1 IMPLANT
PADDING CAST COTTON 4X4 STRL (CAST SUPPLIES) ×1
PADDING CAST COTTON 6X4 STRL (CAST SUPPLIES) ×2 IMPLANT
SET HNDPC FAN SPRY TIP SCT (DISPOSABLE) ×1 IMPLANT
SPONGE GAUZE 4X4 12PLY (GAUZE/BANDAGES/DRESSINGS) ×2 IMPLANT
STAPLER VISISTAT 35W (STAPLE) ×2 IMPLANT
SUCTION FRAZIER TIP 10 FR DISP (SUCTIONS) ×2 IMPLANT
SUT BONE WAX W31G (SUTURE) ×2 IMPLANT
SUT ETHIBOND NAB CT1 #1 30IN (SUTURE) ×6 IMPLANT
SUT MNCRL AB 3-0 PS2 18 (SUTURE) ×2 IMPLANT
SUT VIC AB 0 CT1 27 (SUTURE) ×1
SUT VIC AB 0 CT1 27XBRD ANBCTR (SUTURE) ×1 IMPLANT
SUT VIC AB 1 CT1 27 (SUTURE) ×1
SUT VIC AB 1 CT1 27XBRD ANBCTR (SUTURE) ×1 IMPLANT
SYR CONTROL 10ML LL (SYRINGE) ×2 IMPLANT
TOWEL OR 17X24 6PK STRL BLUE (TOWEL DISPOSABLE) ×2 IMPLANT
TOWEL OR 17X26 10 PK STRL BLUE (TOWEL DISPOSABLE) ×2 IMPLANT
TRAY FOLEY CATH 16FRSI W/METER (SET/KITS/TRAYS/PACK) ×2 IMPLANT
WATER STERILE IRR 1000ML POUR (IV SOLUTION) ×2 IMPLANT
WRAP KNEE MAXI GEL POST OP (GAUZE/BANDAGES/DRESSINGS) ×2 IMPLANT

## 2013-03-28 NOTE — Transfer of Care (Signed)
Immediate Anesthesia Transfer of Care Note  Patient: [name]  Procedure(s) Performed: Procedure(s): TOTAL KNEE ARTHROPLASTY (Right)  Patient Location: PACU  Anesthesia Type:General  Level of Consciousness: awake, alert , oriented and patient cooperative  Airway & Oxygen Therapy: Patient Spontanous Breathing and Patient connected to nasal cannula oxygen  Post-op Assessment: Report given to PACU RN, Post -op Vital signs reviewed and stable and Patient moving all extremities  Post vital signs: Reviewed and stable  Complications: No apparent anesthesia complications

## 2013-03-28 NOTE — Anesthesia Postprocedure Evaluation (Signed)
Anesthesia Post Note  Patient: [name]  Procedure(s) Performed: Procedure(s) (LRB): TOTAL KNEE ARTHROPLASTY (Right)  Anesthesia type: general  Patient location: PACU  Post pain: Pain level controlled  Post assessment: Patient's Cardiovascular Status Stable  Last Vitals:  Filed Vitals:   03/28/13 1030  BP: 129/68  Pulse: 74  Temp:   Resp: 14    Post vital signs: Reviewed and stable  Level of consciousness: sedated  Complications: No apparent anesthesia complications

## 2013-03-28 NOTE — Anesthesia Preprocedure Evaluation (Signed)
Anesthesia Evaluation  Patient identified by MRN, date of birth, ID band Patient awake    Reviewed: Allergy & Precautions, H&P , NPO status , Patient's Chart, lab work & pertinent test results  Airway Mallampati: I TM Distance: >3 FB Neck ROM: Full    Dental   Pulmonary          Cardiovascular     Neuro/Psych    GI/Hepatic GERD-  Controlled,  Endo/Other  Hypothyroidism   Renal/GU      Musculoskeletal   Abdominal   Peds  Hematology   Anesthesia Other Findings   Reproductive/Obstetrics                           Anesthesia Physical Anesthesia Plan  ASA: II  Anesthesia Plan: General   Post-op Pain Management:    Induction: Intravenous  Airway Management Planned: LMA  Additional Equipment:   Intra-op Plan:   Post-operative Plan: Extubation in OR  Informed Consent: I have reviewed the patients History and Physical, chart, labs and discussed the procedure including the risks, benefits and alternatives for the proposed anesthesia with the patient or authorized representative who has indicated his/her understanding and acceptance.     Plan Discussed with: CRNA and Surgeon  Anesthesia Plan Comments:         Anesthesia Quick Evaluation

## 2013-03-28 NOTE — H&P (Signed)
  The recent History & Physical has been reviewed. I have personally examined the patient today. There is no interval change to the documented History & Physical. The patient would like to proceed with the procedure.  [name] W 03/28/2013,  7:26 AM

## 2013-03-28 NOTE — Op Note (Signed)
PATIENT ID:      [name]  MRN:     [id number] DOB/AGE:    58-Jan-1957 / 58 y.o.       OPERATIVE REPORT    DATE OF PROCEDURE:  03/28/2013       PREOPERATIVE DIAGNOSIS:   Right Knee Osteoarthritis-end stage                                                       There is no weight on file to calculate BMI.     POSTOPERATIVE DIAGNOSIS:   Right Knee Osteoarthritis-same                                                                     There is no weight on file to calculate BMI.     PROCEDURE:  Procedure(s): TOTAL KNEE ARTHROPLASTY right     SURGEON:  [name], MD    ASSISTANT:   [name], PA-C   (Present and scrubbed throughout the case, critical for assistance with exposure, retraction, instrumentation, and closure.)          ANESTHESIA: regional and general     DRAINS: (right knee) Hemovact drain(s) in the clamped with  Suction Clamped :      TOURNIQUET TIME:  Total Tourniquet Time Documented: Thigh (Right) - 66 minutes Total: Thigh (Right) - 66 minutes     COMPLICATIONS:  None   CONDITION:  stable  PROCEDURE IN DETAIL: [id number]   [name] 03/28/2013, 9:14 AM

## 2013-03-28 NOTE — Progress Notes (Signed)
Orthopedic Tech Progress Note Patient Details:  [name] 07/30/1955 [id number] CPM applied to Right LE with appropriate settings. OHF applied to bed.  CPM Right Knee CPM Right Knee: On Right Knee Flexion (Degrees): 60 Right Knee Extension (Degrees): 0   Asia R Thompson 03/28/2013, 10:45 AM

## 2013-03-28 NOTE — Plan of Care (Signed)
Problem: Consults Goal: Diagnosis- Total Joint Replacement Primary Total Knee left     

## 2013-03-28 NOTE — Evaluation (Signed)
Physical Therapy Evaluation Patient Details Name: [name] MRN: [id number] DOB: 1956-04-25 Today's Date: 03/28/2013 Time: 4098-1191 PT Time Calculation (min): 21 min  PT Assessment / Plan / Recommendation History of Present Illness  s/p elective Rt TKA   Clinical Impression  Pt is s/p Rt TKA POD#0 resulting in the deficits listed below (see PT Problem List). Pt will benefit from skilled PT to increase their independence and safety with mobility to allow discharge to the venue listed below. Limited to SPT this session due to dizziness and Rt LE buckling. BP at 97/65. Pt highly motivated to return home with family.     PT Assessment  Patient needs continued PT services    Follow Up Recommendations  Home health PT;Supervision/Assistance - 24 hour    Does the patient have the potential to tolerate intense rehabilitation      Barriers to Discharge        Equipment Recommendations  None recommended by PT    Recommendations for Other Services OT consult   Frequency 7X/week    Precautions / Restrictions Precautions Precautions: Fall;Knee Precaution Comments: pt given TKA HEP; reinforced no pillow under knee  Restrictions Weight Bearing Restrictions: Yes RLE Weight Bearing: Partial weight bearing RLE Partial Weight Bearing Percentage or Pounds: 50   Pertinent Vitals/Pain 6/10 prior to session; pt premedicated with IV dilaudid; at end of session pt c/o 1/10 pain and was more lethargic      Mobility  Bed Mobility Bed Mobility: Supine to Sit;Sitting - Scoot to Edge of Bed Supine to Sit: 4: Min assist;HOB elevated;With rails Sitting - Scoot to Edge of Bed: 4: Min guard;With rail Details for Bed Mobility Assistance: (A) to advance Rt LE to/off EOB; cues for hand placement and sequencing; incr time due to pain  Transfers Transfers: Sit to Stand;Stand to Sit;Stand Pivot Transfers Sit to Stand: 3: Mod assist;From elevated surface;From bed;With upper extremity assist Stand to  Sit: 3: Mod assist;With upper extremity assist;To chair/3-in-1;With armrests Stand Pivot Transfers: 3: Mod assist;With armrests Details for Transfer Assistance: pt required mod (A) to achieve upright standing position and perform transfers; Rt LE was buckling  with attempting to amb; cues for hand placement and sequencing with RW  Ambulation/Gait Ambulation/Gait Assistance: Not tested (comment) (Rt LE buckling ) Stairs: No Wheelchair Mobility Wheelchair Mobility: No    Exercises Total Joint Exercises Ankle Circles/Pumps: AROM;Both;10 reps Long Arc Quad: AAROM;Right;10 reps;Seated   PT Diagnosis: Difficulty walking;Acute pain;Generalized weakness  PT Problem List: Decreased strength;Decreased range of motion;Decreased activity tolerance;Decreased balance;Decreased mobility;Decreased safety awareness;Pain PT Treatment Interventions: DME instruction;Gait training;Functional mobility training;Stair training;Therapeutic exercise;Therapeutic activities;Balance training;Neuromuscular re-education;Patient/family education     PT Goals(Current goals can be found in the care plan section) Acute Rehab PT Goals Patient Stated Goal: to get better PT Goal Formulation: With patient Time For Goal Achievement: 04/04/13 Potential to Achieve Goals: Good  Visit Information  Last PT Received On: 03/28/13 History of Present Illness: s/p elective Rt TKA        Prior Functioning  Home Living Family/patient expects to be discharged to:: Private residence Living Arrangements: Spouse/significant other Available Help at Discharge: Family;Available 24 hours/day Type of Home: House Home Access: Stairs to enter [employer] of Steps: 5 Entrance Stairs-Rails: Can reach both Home Layout: One level Home Equipment: Walker - 2 wheels;Bedside commode;Shower seat Additional Comments: shower bench Prior Function Level of Independence: Independent Communication Communication: No difficulties     Cognition  Cognition Arousal/Alertness: Lethargic;Suspect due to medications Behavior During Therapy:  WFL for tasks assessed/performed Overall Cognitive Status: Within Functional Limits for tasks assessed    Extremity/Trunk Assessment Upper Extremity Assessment Upper Extremity Assessment: Defer to OT evaluation Lower Extremity Assessment Lower Extremity Assessment: RLE deficits/detail RLE: Unable to fully assess due to pain RLE Sensation:  (WFL to light touch ) Cervical / Trunk Assessment Cervical / Trunk Assessment: Normal   Balance Balance Balance Assessed: Yes Static Sitting Balance Static Sitting - Balance Support: Bilateral upper extremity supported;Feet supported Static Sitting - Level of Assistance: 5: Stand by assistance  End of Session PT - End of Session Equipment Utilized During Treatment: Gait belt Activity Tolerance: Patient limited by fatigue Patient left: in chair;with call bell/phone within reach;with family/visitor present Nurse Communication: Mobility status CPM Right Knee CPM Right Knee: Off Right Knee Flexion (Degrees): 60 Right Knee Extension (Degrees): 0  GP     Shelva Majestic [place], [place] [phone] 03/28/2013, 2:17 PM

## 2013-03-29 ENCOUNTER — Encounter (HOSPITAL_COMMUNITY): Payer: Self-pay | Admitting: General Practice

## 2013-03-29 LAB — CBC
HCT: 32.8 % — ABNORMAL LOW (ref 36.0–46.0)
Hemoglobin: 10.7 g/dL — ABNORMAL LOW (ref 12.0–15.0)
MCH: 30 pg (ref 26.0–34.0)
MCHC: 32.6 g/dL (ref 30.0–36.0)
MCV: 91.9 fL (ref 78.0–100.0)
Platelets: 169 10*3/uL (ref 150–400)
RDW: 13 % (ref 11.5–15.5)

## 2013-03-29 LAB — BASIC METABOLIC PANEL
BUN: 11 mg/dL (ref 6–23)
Chloride: 109 mEq/L (ref 96–112)
Creatinine, Ser: 0.64 mg/dL (ref 0.50–1.10)
GFR calc non Af Amer: >90 mL/min (ref >90)
Glucose, Bld: 103 mg/dL — ABNORMAL HIGH (ref 70–99)
Potassium: 3.9 mEq/L (ref 3.5–5.1)

## 2013-03-29 NOTE — Progress Notes (Signed)
Patient selected [facility] for home health PT visits.   TNT provided DME/ RW, 3:1, CPM  [facility]/ Judeth Cornfield called with referral for home health PT.

## 2013-03-29 NOTE — Progress Notes (Signed)
Patient ID: SULLY MANZI, female   DOB: April 20, 1956, 57 y.o.   MRN: [id number] PATIENT ID: MARIZA BOURGET        MRN:  [id number]          DOB/AGE: 1955/07/07 / 57 y.o.    [name], MD   [name], PA-C [address] [place], [place]  [address]                             [phone]   PROGRESS NOTE  Subjective:  negative for Chest Pain  negative for Shortness of Breath  negative for Nausea/Vomiting   negative for Calf Pain    Tolerating Diet: yes         Patient reports pain as mild.     Slept well-comfortable  Objective: Vital signs in last 24 hours:   Patient Vitals for the past 24 hrs:  BP Temp Pulse Resp SpO2  03/29/13 0556 106/48 mmHg 99.1 F (37.3 C) 70 18 100 %  03/28/13 2130 109/44 mmHg 98.1 F (36.7 C) 65 18 100 %  03/28/13 1500 122/60 mmHg 98 F (36.7 C) 68 - 98 %  03/28/13 1300 97/65 mmHg 98.1 F (36.7 C) 72 16 97 %  03/28/13 1200 125/62 mmHg 98.1 F (36.7 C) 85 16 99 %  03/28/13 1140 - - - - 98 %  03/28/13 1045 113/49 mmHg 98 F (36.7 C) 72 16 98 %  03/28/13 1030 129/68 mmHg - 74 14 98 %  03/28/13 1015 129/55 mmHg - 76 11 96 %  03/28/13 1000 136/58 mmHg - 76 12 97 %  03/28/13 0945 149/62 mmHg 98.1 F (36.7 C) 88 13 98 %      Intake/Output from previous day:   10/07 0701 - 10/08 0700 In: 2830 [P.O.:480; I.V.:2350] Out: 2350 [Urine:2150; Drains:200]   Intake/Output this shift:       Intake/Output     10/07 0701 - 10/08 0700 10/08 0701 - 10/09 0700   P.O. 480    I.V. 2350    Total Intake 2830     Urine 2150    Drains 200    Total Output 2350     Net +480             LABORATORY DATA: No results found for this basename: WBC, HGB, HCT, PLT,  in the last 168 hours No results found for this basename: NA, K, CL, CO2, BUN, CREATININE, GLUCOSE, CALCIUM,  in the last 168 hours Lab Results  Component Value Date   INR 0.91 03/17/2013    Recent Radiographic Studies :  Chest 2 View  03/17/2013   CLINICAL DATA:  Preop for right total  knee arthroplasty  EXAM: CHEST  2 VIEW  COMPARISON:  10/01/2010  FINDINGS: Cardiomediastinal silhouette is stable. No acute infiltrate or pleural effusion. No pulmonary edema. Left basilar scarring. Mild degenerative changes thoracic spine.  IMPRESSION: No active cardiopulmonary disease. Left basilar scarring. Mild degenerative changes thoracic spine.   Electronically Signed   By: Natasha Mead   On: 03/17/2013 11:02     Examination:  General appearance: alert, cooperative and no distress  Wound Exam: clean, dry, intact   Drainage:  None: wound tissue dry-125cc from hemovac this am, no drainage on dressing  Motor Exam: EHL, FHL, Anterior Tibial and Posterior Tibial Intact  Sensory Exam: Superficial Peroneal, Deep Peroneal and Tibial normal  Vascular Exam: Normal  Assessment:  1 Day Post-Op  Procedure(s) (LRB): TOTAL KNEE ARTHROPLASTY (Right)  ADDITIONAL DIAGNOSIS:  Active Problems:   * No active hospital problems. *  no new problems   Plan: Physical Therapy as ordered Partial Weight Bearing @ 50% (PWB)  DVT Prophylaxis:  Xarelto  DISCHARGE PLAN: Home  DISCHARGE NEEDS: HHPT, CPM, Walker and 3-in-1 comode seat   foley out, will check hemovac drainage this pm, OOB with PT, check labs when available      [name] W 03/29/2013 7:22 AM

## 2013-03-29 NOTE — Op Note (Signed)
[name], [name]                  ACCOUNT NO.:  [id number]  MEDICAL RECORD NO.:  [id number]  LOCATION:  [id number]                        FACILITY:  [facility]  PHYSICIAN:  Claude Manges. Leonidus Rowand, M.D.DATE OF BIRTH:  10-May-1956  DATE OF PROCEDURE:  03/28/2013 DATE OF DISCHARGE:                              OPERATIVE REPORT   PREOPERATIVE DIAGNOSIS:  End-stage osteoarthritis, right knee.  POSTOPERATIVE DIAGNOSIS:  End-stage osteoarthritis, right knee.  PROCEDURE:  Right total knee replacement.  SURGEON:  Claude Manges. Cleophas Dunker, M.D.  ASSISTANT:  Arlys John D. Petrarca, P.A.-C.  ANESTHESIA:  General with supplemental femoral nerve block.  COMPLICATIONS:  None.  COMPONENTS:  DePuy LCS medium femoral component, a 2.5 tibial tray, a 10- mm polyethylene bridging bearing, a 3 PEG metal-backed patella, all was secured polymethyl methacrylate.  DESCRIPTION OF PROCEDURE:  [name] was met with her husband in the holding area, identified the right knee as the appropriate operative site.  She did receive a preoperative femoral nerve block per Anesthesia.  The patient was then transported to room #7 and placed under general anesthesia without difficulty.  Nursing staff inserted a Foley catheter. Urine was clear.  A thigh tourniquet was applied to the right thigh. The leg was then prepped with chlorhexidine scrub and then DuraPrep from the tourniquet to the tips of the toes.  Sterile draping was performed.  With the extremity still elevated was Esmarch exsanguinated with the proximal tourniquet at 350 mmHg.  Midline longitudinal incision was made centered about the patella extending from the superior pouch to tibial tubercle via sharp dissection.  Incision was carried down to subcutaneous tissue.  Small bleeders were Bovie coagulated.  The first layer of capsule incised in midline and medial parapatellar incision was then made with the Bovie. The joint was entered.  There was minimal clear yellow joint  effusion. Patella was easily everted to 180 degrees laterally, the knee flexed to 90 degrees.  There were moderate-sized osteophytes along the medial and lateral femoral condyle and large areas of complete articular cartilage loss in the medial compartment.  There was a fixed varus position.  A medial release was performed along the proximal tibia.  There was a moderate amount of synovitis, this was resected.  We then measured this medium femoral component.  First, bony cut was made transversely in the proximal tibia with a 7- degree angle of declination using the external guide.  After each bony cut on both the tibia and the femur, I used the external guide to check our alignment.  Subsequent cuts after the tibia were made on the femur using the medial femoral guide and a 4-degree distal femoral valgus cut. MCL and LCL remained intact throughout the procedure.  Laminar spreaders were then placed in the medial and lateral compartments.  I removed medial lateral menisci, ACL, and PCL,  and osteophytes from the posterior femoral condyles.  I used the Bovie to control any small bleeding in the posterior capsule.  Flexion and extension gaps were symmetrical at 10 mm.  She did have a preoperative fixed flexure contracture, probably 8-9 degrees.  The finishing taper cuts were then made with the medial femoral guide.  Retractors were  placed around the tibia, was advanced anteriorly and measured a 2.5 tibial tray.  This was pinned in place.  The center hole was made followed by the keeled cut.  With trial tibial jig in place, the 10-mm bridging bearing was applied followed by the medium femoral component  through full range of motion with full extension, no opening with varus and valgus stress and even slight hyperextension. The patella was prepared by removing 10 mm of bone leaving approximately 12-mm patella thickness, the 3 holes were then made to the trial, the trial was applied and  through a full range of motion it remained perfectly stable.  Trial components were then removed.  The joint was copiously irrigated with saline solution.  We changed gloves.  The final components were then impacted with polymethyl methacrylate.  Initially applied the tibial tray with a 10-mm bridging bearing and then the femoral component.  These were impacted in place.  The knee was placed in extension, extraneous methacrylate was removed from the periphery of the components.  The patella was applied with methacrylate and a patellar clamp.  The methacrylate was removed from its periphery as well.  While awaiting for the methacrylate to mature, we injected the capsule with 0.25% Marcaine with epinephrine at approximately 15 minutes of methacrylate was hardened.  We checked the joint.  There was no further extraneous methacrylate.  We again copiously irrigated the joint with saline solution.  Tourniquet was deflated at 66 minutes.  We had nice bleeding throughout the joint.  Small bleeders were Bovie coagulated. Bone wax was applied to the bony holes.  Medium-size Hemovac was placed in the lateral compartment. Deep capsule was closed with interrupted #1 Ethibond, superficial capsule with running 0 Vicryl.  Subcutaneous 2-0 Vicryl and 3-0 Monocryl.  Skin closed with skin clips.  Sterile bulky dressing was applied followed by the patient's support stocking.  The patient tolerated the procedure without complications.     Claude Manges. Cleophas Dunker, M.D.     PWW/MEDQ  D:  03/28/2013  T:  03/29/2013  Job:  [id number]

## 2013-03-29 NOTE — Progress Notes (Signed)
Physical Therapy Treatment Patient Details Name: [name] MRN: [id number] DOB: 26-Mar-1956 Today's Date: 03/29/2013 Time: 1026-1050 PT Time Calculation (min): 24 min  PT Assessment / Plan / Recommendation  History of Present Illness s/p elective Rt TKA    PT Comments   Pt progressing well with mobility.   Increased ambulation distance.    Follow Up Recommendations  Home health PT;Supervision/Assistance - 24 hour     Does the patient have the potential to tolerate intense rehabilitation     Barriers to Discharge        Equipment Recommendations  None recommended by PT    Recommendations for Other Services OT consult  Frequency 7X/week   Progress towards PT Goals Progress towards PT goals: Progressing toward goals  Plan Current plan remains appropriate    Precautions / Restrictions Precautions Precautions: Fall;Knee Restrictions RLE Weight Bearing: Partial weight bearing RLE Partial Weight Bearing Percentage or Pounds: 50   Pertinent Vitals/Pain 8/10 at beginning of session.  6/10 at end of session.  Repositioned for comfort & RN notified for pain medication.     Mobility  Bed Mobility Bed Mobility: Not assessed Transfers Transfers: Sit to Stand;Stand to Sit Sit to Stand: 4: Min guard;With upper extremity assist;With armrests;From chair/3-in-1 Stand to Sit: 4: Min guard;With upper extremity assist;With armrests;To chair/3-in-1 Details for Transfer Assistance: cues for hand placement & technique.   Ambulation/Gait Ambulation/Gait Assistance: 4: Min guard Ambulation Distance (Feet): 140 Feet Assistive device: Rolling walker Ambulation/Gait Assistance Details: cues for sequencing, RW advancement, & to look ahead.   Gait Pattern: Step-to pattern;Antalgic Stairs: No Wheelchair Mobility Wheelchair Mobility: No    Exercises Total Joint Exercises Ankle Circles/Pumps: AROM;Both;10 reps Quad Sets: AROM;Strengthening;Right;10 reps Long Arc Quad:  AAROM;Strengthening;Right;10 reps Knee Flexion: AAROM;Right;10 reps;Seated     PT Goals (current goals can now be found in the care plan section) Acute Rehab PT Goals PT Goal Formulation: With patient Time For Goal Achievement: 04/04/13 Potential to Achieve Goals: Good  Visit Information  Last PT Received On: 03/29/13 Assistance Needed: +1 History of Present Illness: s/p elective Rt TKA     Subjective Data      Cognition  Cognition Arousal/Alertness: Awake/alert Behavior During Therapy: WFL for tasks assessed/performed Overall Cognitive Status: Within Functional Limits for tasks assessed    Balance     End of Session PT - End of Session Equipment Utilized During Treatment: Gait belt Activity Tolerance: Patient tolerated treatment well Patient left: in chair;with call bell/phone within reach;with family/visitor present Nurse Communication: Mobility status   GP     Lara Mulch 03/29/2013, 11:01 AM  Verdell Face, PTA [phone] 03/29/2013

## 2013-03-29 NOTE — Progress Notes (Signed)
Physical Therapy Treatment Patient Details Name: [name] MRN: [id number] DOB: 03-Feb-1956 Today's Date: 03/29/2013 Time: 1410-1435 PT Time Calculation (min): 25 min  PT Assessment / Plan / Recommendation  History of Present Illness s/p elective Rt TKA    PT Comments   Pt moving fairly well.  Completed stair training this session.    Follow Up Recommendations  Home health PT;Supervision/Assistance - 24 hour     Does the patient have the potential to tolerate intense rehabilitation     Barriers to Discharge        Equipment Recommendations  None recommended by PT    Recommendations for Other Services OT consult  Frequency 7X/week   Progress towards PT Goals Progress towards PT goals: Progressing toward goals  Plan Current plan remains appropriate    Precautions / Restrictions Precautions Precautions: Fall;Knee Restrictions RLE Weight Bearing: Partial weight bearing RLE Partial Weight Bearing Percentage or Pounds: 50       Mobility  Bed Mobility Bed Mobility: Sit to Supine Sit to Supine: 4: Min guard;HOB flat Transfers Transfers: Sit to Stand;Stand to Sit Sit to Stand: 4: Min guard;With upper extremity assist;With armrests;From chair/3-in-1 Stand to Sit: 4: Min guard;With upper extremity assist;With armrests;To chair/3-in-1;To bed Details for Transfer Assistance: Pt demonstrated safe hand placement.  Ambulation/Gait Ambulation/Gait Assistance: 4: Min guard Ambulation Distance (Feet): 75 Feet Assistive device: Rolling walker Ambulation/Gait Assistance Details: pt demonstrates proper sequencing & safe RW management Gait Pattern: Step-to pattern;Decreased weight shift to right;Decreased stance time - right;Decreased step length - left;Antalgic Stairs: Yes Stairs Assistance: 4: Min assist Stairs Assistance Details (indicate cue type and reason): (A) for balance & RW management Stair Management Technique: No rails;Step to pattern;Backwards;With walker Number of  Stairs: 3 (2x's) Wheelchair Mobility Wheelchair Mobility: No    Exercises Total Joint Exercises Goniometric ROM: Rt knee flexion ~70 degrees     PT Goals (current goals can now be found in the care plan section) Acute Rehab PT Goals PT Goal Formulation: With patient Time For Goal Achievement: 04/04/13 Potential to Achieve Goals: Good  Visit Information  Last PT Received On: 03/29/13 Assistance Needed: +1 History of Present Illness: s/p elective Rt TKA     Subjective Data      Cognition  Cognition Arousal/Alertness: Awake/alert Behavior During Therapy: WFL for tasks assessed/performed Overall Cognitive Status: Within Functional Limits for tasks assessed    Balance     End of Session PT - End of Session Equipment Utilized During Treatment: Gait belt Activity Tolerance: Patient tolerated treatment well Patient left: in bed;in CPM;with call bell/phone within reach Nurse Communication: Mobility status   GP     Lara Mulch 03/29/2013, 2:54 PM  Verdell Face, PTA [phone] 03/29/2013

## 2013-03-29 NOTE — Evaluation (Signed)
Occupational Therapy Evaluation and Discharge Patient Details Name: [name] MRN: [id number] DOB: [birth year] Today's Date: 03/29/2013 Time: 4098-1191 OT Time Calculation (min): 29 min  OT Assessment / Plan / Recommendation History of present illness s/p elective Rt TKA    Clinical Impression   Pt admitted with above. Education completed. No DME needs.  Pt requiring min assist with LB ADLs but states she will have family to assist at home.  No further acute OT needed. Signing off.    OT Assessment  Patient does not need any further OT services    Follow Up Recommendations  No OT follow up;Supervision/Assistance - 24 hour    Barriers to Discharge      Equipment Recommendations  None recommended by OT    Recommendations for Other Services    Frequency       Precautions / Restrictions Precautions Precautions: Fall;Knee Restrictions Weight Bearing Restrictions: Yes RLE Weight Bearing: Partial weight bearing RLE Partial Weight Bearing Percentage or Pounds: 50   Pertinent Vitals/Pain See vitals    ADL  Eating/Feeding: Performed;Independent Where Assessed - Eating/Feeding: Chair Upper Body Bathing: Simulated;Set up Where Assessed - Upper Body Bathing: Unsupported sitting Lower Body Bathing: Simulated;Minimal assistance Where Assessed - Lower Body Bathing: Supported sit to stand Upper Body Dressing: Performed;Set up Where Assessed - Upper Body Dressing: Unsupported sitting Lower Body Dressing: Performed;Minimal assistance Where Assessed - Lower Body Dressing: Supported sit to stand Toilet Transfer: Simulated;Min Pension scheme manager Method: Sit to Barista:  (simulated with chair) Toileting - Clothing Manipulation and Hygiene: Simulated;Min guard Where Assessed - Toileting Clothing Manipulation and Hygiene: Sit to stand from 3-in-1 or toilet Tub/Shower Transfer: Performed;Min guard Tub/Shower Transfer Method: Land: Counsellor Used: Gait belt;Rolling walker Transfers/Ambulation Related to ADLs: Min guard with RW ADL Comments: Educated pt on LB dressing technique (threading RLE through clothing first).  Pt states she has great family support that will be at home with her to assist with LB dressing. Pt performed tub transfer at min guard level with cues for technique.  Pt able to hook RLE with left foot to bring RLE over tub ledge.     OT Diagnosis:    OT Problem List:   OT Treatment Interventions:     OT Goals(Current goals can be found in the care plan section) Acute Rehab OT Goals Patient Stated Goal: to get better  Visit Information  Last OT Received On: 03/29/13 Assistance Needed: +1 History of Present Illness: s/p elective Rt TKA        Prior Functioning     Home Living Family/patient expects to be discharged to:: Private residence Living Arrangements: Spouse/significant other;Children Available Help at Discharge: Family;Available 24 hours/day Type of Home: House Home Access: Stairs to enter [employer] of Steps: 5 Entrance Stairs-Rails: Can reach both Home Layout: One level Home Equipment: Walker - 2 wheels;Bedside commode;Shower seat;Tub bench;Hand held shower head Prior Function Level of Independence: Independent         Vision/Perception     Cognition  Cognition Arousal/Alertness: Awake/alert Behavior During Therapy: WFL for tasks assessed/performed Overall Cognitive Status: Within Functional Limits for tasks assessed    Extremity/Trunk Assessment Upper Extremity Assessment Upper Extremity Assessment: Overall WFL for tasks assessed     Mobility Bed Mobility Bed Mobility: Not assessed Sit to Supine: 4: Min guard;HOB flat Details for Bed Mobility Assistance: pt up in chair Transfers Transfers: Sit to Stand;Stand to Sit Sit to Stand: 4: Min  guard;From chair/3-in-1;With upper extremity assist (from tub bench) Stand to Sit: 4:  Min guard;To chair/3-in-1 (to tub bench) Details for Transfer Assistance: Pt demonstrated safe hand placement.      Exercise    Balance     End of Session OT - End of Session Equipment Utilized During Treatment: Gait belt;Rolling walker Activity Tolerance: Patient tolerated treatment well Patient left:  (with PT )  GO   03/29/2013 Cipriano Mile OTR/L Pager [phone] Office [phone]   Cipriano Mile 03/29/2013, 3:12 PM

## 2013-03-29 NOTE — Progress Notes (Signed)
Orthopedic Tech Progress Note Patient Details:  [name] 11-12-1955 [id number] On cpm at 8:05 pm RLE 0-60 Patient ID: [name], female   DOB: [birth year], 57 y.o.   MRN: [id number]   [name] 03/29/2013, 8:04 PM

## 2013-03-30 DIAGNOSIS — M1711 Unilateral primary osteoarthritis, right knee: Secondary | ICD-10-CM | POA: Diagnosis present

## 2013-03-30 LAB — CBC WITH DIFFERENTIAL/PLATELET
Basophils Absolute: 0 10*3/uL (ref 0.0–0.1)
Basophils Relative: 0 % (ref 0–1)
Eosinophils Absolute: 0 10*3/uL (ref 0.0–0.7)
Lymphocytes Relative: 7 % — ABNORMAL LOW (ref 12–46)
MCH: 30.4 pg (ref 26.0–34.0)
MCHC: 33.4 g/dL (ref 30.0–36.0)
Neutrophils Relative %: 83 % — ABNORMAL HIGH (ref 43–77)
Platelets: 178 10*3/uL (ref 150–400)

## 2013-03-30 LAB — CBC
HCT: 34.7 % — ABNORMAL LOW (ref 36.0–46.0)
Hemoglobin: 11.6 g/dL — ABNORMAL LOW (ref 12.0–15.0)
MCH: 30.4 pg (ref 26.0–34.0)
MCHC: 33.4 g/dL (ref 30.0–36.0)
RBC: 3.81 MIL/uL — ABNORMAL LOW (ref 3.87–5.11)
RDW: 12.9 % (ref 11.5–15.5)

## 2013-03-30 LAB — BASIC METABOLIC PANEL
BUN: 8 mg/dL (ref 6–23)
Calcium: 9.2 mg/dL (ref 8.4–10.5)
GFR calc Af Amer: >90 mL/min (ref >90)
GFR calc non Af Amer: >90 mL/min (ref >90)
Glucose, Bld: 121 mg/dL — ABNORMAL HIGH (ref 70–99)
Sodium: 138 mEq/L (ref 135–145)

## 2013-03-30 MED ORDER — METHOCARBAMOL 500 MG PO TABS: 500.0000 mg | ORAL_TABLET | Freq: Four times a day (QID) | ORAL | Status: DC | PRN

## 2013-03-30 MED ORDER — RIVAROXABAN 10 MG PO TABS: 10.0000 mg | ORAL_TABLET | Freq: Every day | ORAL | Status: DC

## 2013-03-30 MED ORDER — HYDROMORPHONE HCL 2 MG PO TABS: 1.0000 mg | ORAL_TABLET | ORAL | Status: DC | PRN

## 2013-03-30 NOTE — Progress Notes (Signed)
Patient ID: [name], female   DOB: 1955/11/03, 57 y.o.   MRN: [id number] PATIENT ID: [name]        MRN:  [id number]          DOB/AGE: [birth year] / 57 y.o.    Kathryn Campbell, MD   Kathryn Code, PA-C [address] [place], [place]  [address]                             [phone]   PROGRESS NOTE  Subjective:  negative for Chest Pain  negative for Shortness of Breath  negative for Nausea/Vomiting   negative for Calf Pain    Tolerating Diet: yes         Patient reports pain as mild.     Anxious for D/C-comfortable  Objective: Vital signs in last 24 hours:   Patient Vitals for the past 24 hrs:  BP Temp Pulse Resp SpO2  03/30/13 0500 121/57 mmHg 98.3 F (36.8 C) 83 16 95 %  03/29/13 2135 124/49 mmHg 98.5 F (36.9 C) 86 16 95 %  03/29/13 1300 108/45 mmHg 99.1 F (37.3 C) 81 18 97 %      Intake/Output from previous day:   10/08 0701 - 10/09 0700 In: -  Out: 25 [Drains:25]   Intake/Output this shift:       Intake/Output     10/08 0701 - 10/09 0700 10/09 0701 - 10/10 0700   P.O.     I.V.     Total Intake       Urine 0    Drains 25    Total Output 25     Net -25          Urine Occurrence 5 x       LABORATORY DATA:  Recent Labs  03/29/13 0625  WBC 12.8*  HGB 10.7*  HCT 32.8*  PLT 169    Recent Labs  03/29/13 0625  NA 139  K 3.9  CL 109  CO2 23  BUN 11  CREATININE 0.64  GLUCOSE 103*  CALCIUM 8.5   Lab Results  Component Value Date   INR 0.91 03/17/2013    Recent Radiographic Studies :  Chest 2 View  03/17/2013   CLINICAL DATA:  Preop for right total knee arthroplasty  EXAM: CHEST  2 VIEW  COMPARISON:  10/01/2010  FINDINGS: Cardiomediastinal silhouette is stable. No acute infiltrate or pleural effusion. No pulmonary edema. Left basilar scarring. Mild degenerative changes thoracic spine.  IMPRESSION: No active cardiopulmonary disease. Left basilar scarring. Mild degenerative changes thoracic spine.   Electronically Signed   By:  [name]   On: 03/17/2013 11:02     Examination:  General appearance: alert, cooperative and no distress  Wound Exam: clean, dry, intact   Drainage:  None: wound tissue dry  Motor Exam: EHL, FHL, Anterior Tibial and Posterior Tibial Intact  Sensory Exam: Superficial Peroneal, Deep Peroneal and Tibial normal  Vascular Exam: Normal  Assessment:    2 Days Post-Op  Procedure(s) (LRB): TOTAL KNEE ARTHROPLASTY (Right)  ADDITIONAL DIAGNOSIS:  Active Problems:   * No active hospital problems. *     Plan: Physical Therapy as ordered Partial Weight Bearing @ 50% (PWB)  DVT Prophylaxis:  Xarelto  DISCHARGE PLAN: Home  DISCHARGE NEEDS: HHPT, CPM, Walker and 3-in-1 comode seat   dressing changed-wound clean,dry without calf pain-ready for D/C    [name] W 03/30/2013  7:06 AM

## 2013-03-30 NOTE — Progress Notes (Signed)
PT d/c to home this afternoon.  IV removed.  Prescriptions and instructions given to patient.  HHPT/RN set up.

## 2013-03-30 NOTE — Progress Notes (Signed)
Physical Therapy Treatment Patient Details Name: [name] MRN: [id number] DOB: 11-25-1955 Today's Date: 03/30/2013 Time: 4098-1191 PT Time Calculation (min): 34 min  PT Assessment / Plan / Recommendation  History of Present Illness s/p elective Rt TKA    PT Comments   Pt c/o increased pain & stiffness in Rt knee compared to yesterday.  Only able to tolerate ~60 degrees knee flexion this session.  Despite pain & stiffness, pt moving fairly well but just requires increased time.  She did require increased (A) to achieve standing today.     Follow Up Recommendations  Home health PT;Supervision/Assistance - 24 hour     Does the patient have the potential to tolerate intense rehabilitation     Barriers to Discharge        Equipment Recommendations  None recommended by PT    Recommendations for Other Services OT consult  Frequency 7X/week   Progress towards PT Goals    Plan Current plan remains appropriate    Precautions / Restrictions Precautions Precautions: Fall;Knee Restrictions RLE Weight Bearing: Partial weight bearing RLE Partial Weight Bearing Percentage or Pounds: 50   Pertinent Vitals/Pain 7/10 Rt knee.  RN notified.      Mobility  Bed Mobility Bed Mobility: Supine to Sit;Sitting - Scoot to Edge of Bed;Sit to Supine Supine to Sit: HOB flat;4: Min assist Sitting - Scoot to Edge of Bed: 5: Supervision Sit to Supine: 5: Supervision;HOB flat Details for Bed Mobility Assistance: Incr time.  Min (A) to initiate movement with RLE to EOB.    Transfers Transfers: Sit to Stand;Stand to Sit Sit to Stand: 4: Min assist;With upper extremity assist;From bed;From toilet Stand to Sit: 4: Min guard;With upper extremity assist;With armrests;To toilet;To bed;4: Min assist Details for Transfer Assistance: Pt required increased (A) to achieve standing today & (A) to control descent to lower surface (toilet) Ambulation/Gait Ambulation/Gait Assistance: 4: Min guard Ambulation  Distance (Feet): 40 Feet Assistive device: Rolling walker Ambulation/Gait Assistance Details: Pt c/o increased pain today limiting distance.   Gait Pattern: Step-to pattern;Decreased step length - right;Decreased step length - left;Decreased weight shift to left;Antalgic Gait velocity: decreased Stairs: No    Exercises Total Joint Exercises Ankle Circles/Pumps: AROM;Both;10 reps;Supine Hip ABduction/ADduction: AAROM;Strengthening;Right;10 reps;Supine Straight Leg Raises: AAROM;Strengthening;Right;10 reps;Supine Knee Flexion: AAROM;Right;5 reps;Limitations;Seated Knee Flexion Limitations: limited by pain; Knee felt more stiff compared to yesterday Goniometric ROM: Rt knee flexion AAROM ~60 degrees.     P    PT Goals (current goals can now be found in the care plan section) Acute Rehab PT Goals PT Goal Formulation: With patient Time For Goal Achievement: 04/04/13 Potential to Achieve Goals: Good  Visit Information  Last PT Received On: 03/30/13 Assistance Needed: +1 History of Present Illness: s/p elective Rt TKA     Subjective Data      Cognition  Cognition Arousal/Alertness: Awake/alert Behavior During Therapy: WFL for tasks assessed/performed Overall Cognitive Status: Within Functional Limits for tasks assessed    Balance     End of Session PT - End of Session Activity Tolerance: Patient tolerated treatment well;Patient limited by pain Patient left: in bed;in CPM;with call bell/phone within reach; CPM 0-55 degrees.   Nurse Communication: Mobility status   GP     Lara Mulch 03/30/2013, 10:00 AM   Verdell Face, PTA [phone] 03/30/2013

## 2013-03-30 NOTE — Progress Notes (Signed)
PT Cancellation Note  Patient Details Name: [name] MRN: [id number] DOB: 1955/11/03   Cancelled Treatment:    Reason Eval/Treat Not Completed:  With c/o feeling nauseaus & requesting to rest.  RN made aware.  Pt states she feels comfortable with d/cing home later today.      Verdell Face, [place] [phone] 03/30/2013

## 2013-03-30 NOTE — Discharge Summary (Signed)
[name], MD   [name], PA-C [address], [place], [place]  [address]                             [phone]  PATIENT ID: [name]        MRN:  [id number]          DOB/AGE: 03/08/1956 / 57 y.o.    DISCHARGE SUMMARY  ADMISSION DATE:    03/28/2013 DISCHARGE DATE:   03/30/2013   ADMISSION DIAGNOSIS: Right Knee Osteoarthritis    DISCHARGE DIAGNOSIS:  Right Knee Osteoarthritis    ADDITIONAL DIAGNOSIS: Principal Problem:   Osteoarthritis of right knee  Past Medical History  Diagnosis Date  . Pneumonia 1992  . Bipolar disorder   . Migraine   . Depression   . GERD (gastroesophageal reflux disease)   . Arthritis   . Osteoporosis   . Hypothyroidism     PROCEDURE: Procedure(s): TOTAL KNEE ARTHROPLASTY Right on 03/28/2013  CONSULTS: none     HISTORY: HISTORY OF PRESENT ILLNESS: [name] is a very pleasant 57 year old white female who is seen today for evaluation of her right knee. We had seen her approximately 1 year ago for evaluation of her right knee. She has had previous surgeries in 1981 where she has had 3 scars placed on her knee, that of 1 medial, 1 lateral and 1 mid medial aspect. She apparently has had a meniscectomy performed. She was starting to have some catching and problems when she was walking and pain on the medial joint line. At that time it was chronically worsening and was quite moderately severe and was also having catching when she went into flexion. She tried ibuprofen and Aleve without much benefit. At that time she underwent a corticosteroid injection. She had actually done well until June of 2014. She re-experienced her symptoms again. At that time she received a corticosteroid injection by Dr. Cleophas Dunker. The injection was very minimally beneficial. She is starting to have to place a pillow between her legs and started having difficulty again with locking at times and significant pain and discomfort. She is now starting to lack motion  secondary to her pain. A discussion of total joint replacement was had at that time, but she would like to try a Hyalgan injections series. We did have 1 injection given back on July 9 and apparently then the insurance decided not to pay for the injection so she could not have that performed. She has continued to have increasing pain and discomfort and now also is having problems with motion both flexion and extension. Her pain is now pretty much on a daily basis. Her bad days out number her good days. She does have some swelling in the knee that does bother her. She is also having mechanical symptoms. 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 She has gotten to the point now where she cannot stand the pain, is having nighttime pain.   HOSPITAL COURSE:  [name] is a 57 y.o. admitted on 03/28/2013 and found to have a diagnosis of Right Knee Osteoarthritis.  After appropriate laboratory studies were obtained  they were taken to the operating room on 03/28/2013 and underwent  Procedure(s): TOTAL KNEE ARTHROPLASTY  Right.   They were given perioperative antibiotics:  Anti-infectives   Start     Dose/Rate Route Frequency Ordered Stop   03/28/13 1345  ceFAZolin (ANCEF) IVPB 2 g/50 mL premix     2 g 100 mL/hr over 30 Minutes Intravenous Every 6 hours 03/28/13 1116 03/28/13 2108   03/28/13 0600  ceFAZolin (ANCEF) IVPB 2 g/50 mL premix     2 g 100 mL/hr over 30 Minutes Intravenous On call to O.R. 03/27/13 1248 03/28/13 0745    .  Tolerated the procedure well.  Placed with a foley intraoperatively.    Toradol was given post op.  POD #1, allowed out of bed to a chair.  PT for ambulation and exercise program.  Foley D/C'd in morning.  IV saline locked.  O2 discontionued. Hemovac pulled.  POD #2, continued PT and ambulation.    The remainder of the hospital course was dedicated to ambulation and strengthening.   The patient was discharged on 2 Days  Post-Op in  Stable condition.  Blood products given:none  DIAGNOSTIC STUDIES: Recent vital signs: Patient Vitals for the past 24 hrs:  BP Temp Pulse Resp SpO2  03/30/13 0500 121/57 mmHg 98.3 F (36.8 C) 83 16 95 %  03/29/13 2135 124/49 mmHg 98.5 F (36.9 C) 86 16 95 %  03/29/13 1300 108/45 mmHg 99.1 F (37.3 C) 81 18 97 %       Recent laboratory studies:  Recent Labs  03/29/13 0625 03/30/13 0730  WBC 12.8* 17.8*  HGB 10.7* 11.6*  HCT 32.8* 34.7*  PLT 169 207    Recent Labs  03/29/13 0625 03/30/13 0730  NA 139 138  K 3.9 3.9  CL 109 104  CO2 23 22  BUN 11 8  CREATININE 0.64 0.57  GLUCOSE 103* 121*  CALCIUM 8.5 9.2   Lab Results  Component Value Date   INR 0.91 03/17/2013     Recent Radiographic Studies :  Chest 2 View  03/17/2013   CLINICAL DATA:  Preop for right total knee arthroplasty  EXAM: CHEST  2 VIEW  COMPARISON:  10/01/2010  FINDINGS: Cardiomediastinal silhouette is stable. No acute infiltrate or pleural effusion. No pulmonary edema. Left basilar scarring. Mild degenerative changes thoracic spine.  IMPRESSION: No active cardiopulmonary disease. Left basilar scarring. Mild degenerative changes thoracic spine.   Electronically Signed   By: Natasha Mead   On: 03/17/2013 11:02    DISCHARGE INSTRUCTIONS: Discharge Orders   Future Orders Complete By Expires   Call MD / Call 911  As directed    Comments:     If you experience chest pain or shortness of breath, CALL 911 and be transported to the hospital emergency room.  If you develope a fever above 101 F, pus (white drainage) or increased drainage or redness at the wound, or calf pain, call your surgeon's office.   Change dressing  As directed    Comments:     Change dressing on Saturday, then change the dressing daily with sterile 4 x 4 inch gauze dressing and apply TED hose.  You may clean the incision with alcohol prior to redressing.   Constipation Prevention  As directed    Comments:     Drink plenty of  fluids.  Prune juice and/or coffee may be helpful.  You may use a stool softener, such as Colace (over the counter) 100 mg twice a day.  Use MiraLax (over the counter) for constipation as needed but this may take several days to work.  Mag Citrate --OR-- Milk of Magnesia may also be used but follow directions on the label.   CPM  As directed  Comments:     Continuous passive motion machine (CPM):      Use the CPM from 0 to 60 for 6-8 hours per day.      You may increase by 5-10 per day.  You may break it up into 2 or 3 sessions per day.      Use CPM for 3-4 weeks or until you are told to stop.   Diet general  As directed    Do not put a pillow under the knee. Place it under the heel.  As directed    Driving restrictions  As directed    Comments:     No driving for 6 weeks   Increase activity slowly as tolerated  As directed    Lifting restrictions  As directed    Comments:     No lifting for 6 weeks   Partial weight bearing  As directed    Comments:     50 % WEIGHT BEARING AS TAUGHT IN PHYSICAL THERAPY   Questions:     % Body Weight:     Laterality:     Extremity:     Patient may shower  As directed    Comments:     You may shower over the brown dressing.  Once the dressing is removed you may shower without a dressing once there is no drainage.  Do not wash over the wound.  If drainage remains, cover wound with plastic wrap and then shower.   TED hose  As directed    Comments:     Use stockings (TED hose) for 2 weeks on operative leg(s).  You may remove them at night for sleeping.      DISCHARGE MEDICATIONS:     Medication List    STOP taking these medications       acetaminophen 500 MG tablet  Commonly known as:  TYLENOL     ibuprofen 200 MG tablet  Commonly known as:  ADVIL,MOTRIN      TAKE these medications       alendronate 70 MG tablet  Commonly known as:  FOSAMAX  Take 70 mg by mouth every 7 (seven) days. Take with a full glass of water on an empty stomach. On  Sundays.     dexlansoprazole 60 MG capsule  Commonly known as:  DEXILANT  Take 60 mg by mouth daily.     HYDROmorphone 2 MG tablet  Commonly known as:  DILAUDID  Take 0.5 tablets (1 mg total) by mouth every 4 (four) hours as needed.     levothyroxine 50 MCG tablet  Commonly known as:  SYNTHROID, LEVOTHROID  Take 50 mcg by mouth daily before breakfast.     lithium carbonate 300 MG capsule  Take 300 mg by mouth 2 (two) times daily with a meal.     methocarbamol 500 MG tablet  Commonly known as:  ROBAXIN  Take 1 tablet (500 mg total) by mouth every 6 (six) hours as needed.     rivaroxaban 10 MG Tabs tablet  Commonly known as:  XARELTO  Take 1 tablet (10 mg total) by mouth daily before supper.     sertraline 50 MG tablet  Commonly known as:  ZOLOFT  Take 50 mg by mouth every morning.     varenicline 1 MG tablet  Commonly known as:  CHANTIX  Take 1 mg by mouth 2 (two) times daily.     Vitamin D (Ergocalciferol) 50000 UNITS Caps capsule  Commonly known as:  DRISDOL  Take 50,000 Units by mouth 2 (two) times a week. On Monday and Friday        FOLLOW UP VISIT:       Follow-up Information   Follow up with [facility], PA-C On 04/12/2013.   Specialty:  Orthopedic Surgery   Contact information:   [address] [place] [address] [phone]      CBC, Differential on Monday by [facility] ordered  DISPOSITION:   Home  CONDITION:  Stable   Miloh Alcocer 03/30/2013, 8:57 AM

## 2013-03-30 NOTE — Progress Notes (Signed)
03/30/13 Order to add [facility] for CBC draw on 04/03/13. Contacted Debbie at [facility] for lab draw.Jacquelynn Cree RN, BSN, CCM      03/29/2013  [address] RN, [place]  [phone] CM referral:  Home Health PT                       DME:  CPM, 3:1, RW  met with patient to discuss discharge planning for home health PT.  She selected [facility]. TNT provided DME/ RW, 3:1, CPM  [facility]/ Judeth Cornfield called with referral for home health PT.

## 2013-03-31 ENCOUNTER — Encounter (HOSPITAL_COMMUNITY): Payer: Self-pay | Admitting: Orthopaedic Surgery

## 2013-04-27 ENCOUNTER — Ambulatory Visit: Payer: 59 | Attending: Orthopaedic Surgery | Admitting: Physical Therapy

## 2013-04-27 ENCOUNTER — Other Ambulatory Visit: Payer: Self-pay

## 2013-04-27 DIAGNOSIS — M25569 Pain in unspecified knee: Secondary | ICD-10-CM | POA: Insufficient documentation

## 2013-04-27 DIAGNOSIS — R5381 Other malaise: Secondary | ICD-10-CM | POA: Insufficient documentation

## 2013-04-27 DIAGNOSIS — R269 Unspecified abnormalities of gait and mobility: Secondary | ICD-10-CM | POA: Insufficient documentation

## 2013-04-27 DIAGNOSIS — IMO0001 Reserved for inherently not codable concepts without codable children: Secondary | ICD-10-CM | POA: Insufficient documentation

## 2013-04-27 DIAGNOSIS — M25669 Stiffness of unspecified knee, not elsewhere classified: Secondary | ICD-10-CM | POA: Insufficient documentation

## 2013-05-01 ENCOUNTER — Ambulatory Visit: Payer: 59 | Admitting: Physical Therapy

## 2013-05-02 ENCOUNTER — Ambulatory Visit: Payer: 59

## 2013-05-04 ENCOUNTER — Ambulatory Visit: Payer: 59 | Admitting: Physical Therapy

## 2013-05-08 ENCOUNTER — Ambulatory Visit: Payer: 59 | Admitting: Physical Therapy

## 2013-05-09 ENCOUNTER — Ambulatory Visit: Payer: 59 | Admitting: Physical Therapy

## 2013-05-11 ENCOUNTER — Ambulatory Visit: Payer: 59 | Admitting: Physical Therapy

## 2013-05-16 ENCOUNTER — Encounter: Payer: 59 | Admitting: Physical Therapy

## 2013-05-29 ENCOUNTER — Encounter: Payer: 59 | Admitting: Physical Therapy

## 2013-05-30 ENCOUNTER — Ambulatory Visit: Payer: 59 | Attending: Orthopaedic Surgery | Admitting: Physical Therapy

## 2013-05-30 DIAGNOSIS — M25669 Stiffness of unspecified knee, not elsewhere classified: Secondary | ICD-10-CM | POA: Insufficient documentation

## 2013-05-30 DIAGNOSIS — R269 Unspecified abnormalities of gait and mobility: Secondary | ICD-10-CM | POA: Insufficient documentation

## 2013-05-30 DIAGNOSIS — M25569 Pain in unspecified knee: Secondary | ICD-10-CM | POA: Insufficient documentation

## 2013-05-30 DIAGNOSIS — R5381 Other malaise: Secondary | ICD-10-CM | POA: Insufficient documentation

## 2013-05-30 DIAGNOSIS — IMO0001 Reserved for inherently not codable concepts without codable children: Secondary | ICD-10-CM | POA: Insufficient documentation

## 2013-06-01 ENCOUNTER — Ambulatory Visit: Payer: 59 | Admitting: Physical Therapy

## 2013-06-05 ENCOUNTER — Ambulatory Visit: Payer: 59 | Admitting: Physical Therapy

## 2013-06-06 ENCOUNTER — Ambulatory Visit: Payer: 59 | Admitting: Physical Therapy

## 2013-06-08 ENCOUNTER — Ambulatory Visit: Payer: 59 | Admitting: *Deleted

## 2013-06-12 ENCOUNTER — Ambulatory Visit: Payer: 59 | Admitting: Physical Therapy

## 2013-06-13 ENCOUNTER — Ambulatory Visit: Payer: 59 | Admitting: Physical Therapy

## 2013-06-19 ENCOUNTER — Ambulatory Visit: Payer: 59 | Admitting: *Deleted

## 2013-06-20 ENCOUNTER — Ambulatory Visit: Payer: 59 | Admitting: *Deleted

## 2014-10-29 ENCOUNTER — Encounter (HOSPITAL_COMMUNITY)
Admission: RE | Admit: 2014-10-29 | Discharge: 2014-10-29 | Disposition: A | Discharge status: Home / Self Care | Payer: PRIVATE HEALTH INSURANCE | Source: Ambulatory Visit | Attending: Orthopaedic Surgery | Admitting: Orthopaedic Surgery

## 2014-10-29 ENCOUNTER — Encounter (HOSPITAL_COMMUNITY): Payer: Self-pay

## 2014-10-29 ENCOUNTER — Ambulatory Visit (HOSPITAL_COMMUNITY)
Admission: RE | Admit: 2014-10-29 | Discharge: 2014-10-29 | Disposition: A | Discharge status: Home / Self Care | Payer: PRIVATE HEALTH INSURANCE | Source: Ambulatory Visit | Attending: Orthopedic Surgery | Admitting: Orthopedic Surgery

## 2014-10-29 DIAGNOSIS — Z01812 Encounter for preprocedural laboratory examination: Secondary | ICD-10-CM | POA: Diagnosis not present

## 2014-10-29 DIAGNOSIS — Z0183 Encounter for blood typing: Secondary | ICD-10-CM | POA: Diagnosis not present

## 2014-10-29 DIAGNOSIS — Z01818 Encounter for other preprocedural examination: Secondary | ICD-10-CM | POA: Diagnosis present

## 2014-10-29 DIAGNOSIS — Z01810 Encounter for preprocedural cardiovascular examination: Secondary | ICD-10-CM | POA: Insufficient documentation

## 2014-10-29 HISTORY — DX: Other skin changes: R23.8

## 2014-10-29 HISTORY — DX: Spontaneous ecchymoses: R23.3

## 2014-10-29 LAB — COMPREHENSIVE METABOLIC PANEL
ALT: 11 U/L — ABNORMAL LOW (ref 14–54)
ANION GAP: 8 (ref 5–15)
AST: 14 U/L — ABNORMAL LOW (ref 15–41)
Albumin: 3.9 g/dL (ref 3.5–5.0)
Alkaline Phosphatase: 122 U/L (ref 38–126)
CO2: 21 mmol/L — ABNORMAL LOW (ref 22–32)
CREATININE: 0.8 mg/dL (ref 0.44–1.00)
Calcium: 9.4 mg/dL (ref 8.9–10.3)
Chloride: 107 mmol/L (ref 101–111)
GFR calc Af Amer: >60 mL/min (ref >60)
Glucose, Bld: 86 mg/dL (ref 70–99)
Potassium: 3.3 mmol/L — ABNORMAL LOW (ref 3.5–5.1)
Sodium: 136 mmol/L (ref 135–145)
Total Bilirubin: 0.2 mg/dL — ABNORMAL LOW (ref 0.3–1.2)
Total Protein: 6.7 g/dL (ref 6.5–8.1)

## 2014-10-29 LAB — CBC WITH DIFFERENTIAL/PLATELET
BASOS ABS: 0.1 10*3/uL (ref 0.0–0.1)
BASOS PCT: 1 % (ref 0–1)
Eosinophils Absolute: 0.2 10*3/uL (ref 0.0–0.7)
Eosinophils Relative: 2 % (ref 0–5)
HEMATOCRIT: 43.6 % (ref 36.0–46.0)
HEMOGLOBIN: 14 g/dL (ref 12.0–15.0)
LYMPHS PCT: 26 % (ref 12–46)
Lymphs Abs: 2.8 10*3/uL (ref 0.7–4.0)
MCH: 29.7 pg (ref 26.0–34.0)
MCHC: 32.1 g/dL (ref 30.0–36.0)
MCV: 92.4 fL (ref 78.0–100.0)
Monocytes Absolute: 0.8 10*3/uL (ref 0.1–1.0)
Monocytes Relative: 8 % (ref 3–12)
NEUTROS PCT: 63 % (ref 43–77)
Neutro Abs: 7 10*3/uL (ref 1.7–7.7)
Platelets: 264 10*3/uL (ref 150–400)
RBC: 4.72 MIL/uL (ref 3.87–5.11)
RDW: 14 % (ref 11.5–15.5)
WBC: 11 10*3/uL — ABNORMAL HIGH (ref 4.0–10.5)

## 2014-10-29 LAB — URINALYSIS, ROUTINE W REFLEX MICROSCOPIC
Bilirubin Urine: NEGATIVE
GLUCOSE, UA: NEGATIVE mg/dL
Ketones, ur: NEGATIVE mg/dL
LEUKOCYTES UA: NEGATIVE
Nitrite: NEGATIVE
PROTEIN: NEGATIVE mg/dL
Specific Gravity, Urine: 1.006 (ref 1.005–1.030)
UROBILINOGEN UA: 0.2 mg/dL (ref 0.0–1.0)
pH: 6.5 (ref 5.0–8.0)

## 2014-10-29 LAB — SURGICAL PCR SCREEN
MRSA, PCR: NEGATIVE
Staphylococcus aureus: NEGATIVE

## 2014-10-29 LAB — TYPE AND SCREEN
ABO/RH(D): A POS
Antibody Screen: NEGATIVE

## 2014-10-29 LAB — URINE MICROSCOPIC-ADD ON

## 2014-10-29 LAB — APTT: aPTT: 28 seconds (ref 24–37)

## 2014-10-29 LAB — PROTIME-INR
INR: 0.99 (ref 0.00–1.49)
Prothrombin Time: 13.2 seconds (ref 11.6–15.2)

## 2014-10-30 LAB — URINE CULTURE: Colony Count: 10000

## 2014-11-02 NOTE — H&P (Signed)
CHIEF COMPLAINT:  Painful right knee.   HISTORY OF PRESENT ILLNESS:  [name] is a very pleasant 59 year old white female who had undergone a right total knee arthroplasty in October of 2014.  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  She had done well with her knee, however she started having some problems with the knee with swelling and discomfort and stiffness then back in March of this year.  At that time she had an effusion and then an aspiration was performed on August 22, 2014, which revealed negative for any anaerobic or aerobic growth.  C-reactive protein of 24 and a sed rate was 5.  Her CBC was normal with a 9,900 white count.  She is also a cigarette smoker and has been noted to have osteoporosis with a T-score of -2.6.  She has continued to have symptoms and on the 16 she did have a corticosteroid injection to the knee, which was helpful for a short time, but then she started having some more pain.  She is being treated with Forteo for her osteopenia and osteoporosis.  She continues to have pain and discomfort in the knee.  A 3-phase bone scan was ordered, which showed abnormal asymmetric increase uptake in all 3 phases localizing to the right knee arthroplasty compatible with either arthroplasty device loosening or infection.  Therefore it was felt that she probably has had loosening of the prosthesis and is now being admitted at this time for a revision right total knee arthroplasty.     Past medical history and general health is fair.  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     SURGERIES:   1.  Appendectomy in 1974.  2.  D&C in 1975. 3.  Removal of right fallopian tube and ovary for an ectopic pregnancy in 1975. 4.  Right knee arthroscopic debridement cartilage tear in 1982.   5.  Carpal tunnel release on the right hand in 1985. 6.  Childbirth and D&C in 1986. 7.  Open reduction internal fixation left ankle in  2002. 8.  Right total knee arthroplasty in 2014.   HOSPITALIZATIONS:   1.  Pneumonia in 1992.   2.  Hypotension, hypokalemic, and hypomagnesia in 2000.   MEDICATIONS:   1.  Dexilant 60 mg once daily. 2  Lithium carbonate 300 mg twice daily. 3.  Synthroid 50 mcg daily. 4.  Duloxetine 60 mg daily. 5.  Forteo 20 mcg once daily. 6.  Tylenol 500 p.r.n. 7.  Ibuprofen 200 p.r.n. 8.  Primidone 200 mg at bedtime.   ALLERGIES:  CODEINE, PERCOCET, and VICODIN which causes her swelling and urticaria.  She is able to tolerate hydromorphone.     REVIEW OF SYSTEMS:  A 14-point is negative except for glasses, palpitations, but none recent.  She has had pneumonia in 1992, which also had given her a syncopal episode.  She has also been known to be hypothyroid and is on Synthroid for that.  Does have nocturia 1-2 times per evening.  She does have migraines about every 2-3 weeks, but is stopped with Tylenol.  She has been recently diagnosed with an intention tremor.  She is on Primidone 200 mg at bedtime.     FAMILY HISTORY:  Positive for a mother who remains alive at age 60 with arthritis and seizures.  Father who died at the age of 60 with endstage COPD.  She has no brothers.  One  sister age 60 and is alive but has diabetes and hypertension.     SOCIAL HISTORY:  She is a 59 year old white married female, a CNA.  She smokes 1 pack of cigarettes per day and she has smoked for 41 years.  She has not stopped. She does not drink.     PHYSICAL EXAMINATION:  Height is 5 feet 7 inches, weight 154 pounds with BMI of 24.1.  Vital signs:  Temperature 97.3, pulse 72, respirations 16, blood pressure 128/80.  Head is normocephalic.  Eyes:  Pupils equal, round, and reactive to light and accommodation with extraocular movement intact.  Ears, nose, and throat were benign.  Neck was supple and no bruits were noted.  Chest had good expansion.  Lungs were essentially clear with decreased breath sounds.  No wheezes were noted.   Cardiac had a regular rhythm and rate, distant heart sounds, normal S1 and S2 and no murmurs.  Pulse is 1+ bilateral and symmetric in lower extremities.  Abdomen and scaphoid is soft and nontender and no masses are palpable.  Normal bowel sounds are present.  CNS:  Oriented x3 and cranial nerves II-XII are grossly intact.  Genital, rectal, and breast exam not indicated for orthopedic procedure.     Musculoskeletal:  Today range of motion from about 10 degrees to about 90 degrees.  She does not have much in the way of instability with regards to valgus stressing.  She does have a minimal effusion.  Well healed surgical incisions.     RADIOGRAPHS:  Bone scan as listed above.   CLINICAL IMPRESSION:   1.  Loosened right total knee arthroplasty. 2.  A 41 pack year history of cigarette smoking.   3.  Hypothyroidism. 4.  Migraines. 5.  Depression. 6.  Intention tremor. 7.  Osteoporosis.   RECOMMENDATIONS:  At this time I have reviewed the notes and she has been cleared by Dr. Charm BargesButler for surgical intervention.  At this time our plan is for revision total knee replacement.  I have explained the procedure, risks and benefits in detail with her and all questions have been answered.  Also I told her about the fact that if this is infected we will remove all of the prosthesis and place a antibiotic spacer.    Oris DroneBrian D. Aleda Granaetrarca, PA-C [facility] [phone]  11/02/2014 3:52 PM

## 2014-11-05 MED ORDER — CHLORHEXIDINE GLUCONATE 4 % EX LIQD
60.0000 mL | Freq: Once | CUTANEOUS | Status: DC
  Filled 2014-11-05: qty 60

## 2014-11-05 MED ORDER — CEFAZOLIN SODIUM-DEXTROSE 2-3 GM-% IV SOLR: 2.0000 g | INTRAVENOUS | Status: DC

## 2014-11-05 MED ORDER — ACETAMINOPHEN 10 MG/ML IV SOLN
1000.0000 mg | INTRAVENOUS | Status: AC
  Administered 2014-11-06: 1000 mg via INTRAVENOUS

## 2014-11-05 MED ORDER — SODIUM CHLORIDE 0.9 % IV SOLN: INTRAVENOUS | Status: DC

## 2014-11-06 ENCOUNTER — Inpatient Hospital Stay (HOSPITAL_COMMUNITY): Payer: PRIVATE HEALTH INSURANCE | Admitting: Certified Registered Nurse Anesthetist

## 2014-11-06 ENCOUNTER — Encounter (HOSPITAL_COMMUNITY)
Admission: RE | Disposition: A | Discharge status: Home Health | Payer: Self-pay | Source: Ambulatory Visit | Attending: Orthopaedic Surgery

## 2014-11-06 ENCOUNTER — Inpatient Hospital Stay (HOSPITAL_COMMUNITY)
Admission: RE | Admit: 2014-11-06 | Discharge: 2014-11-08 | DRG: 468 | Disposition: A | Discharge status: Home Health | Payer: PRIVATE HEALTH INSURANCE | Source: Ambulatory Visit | Attending: Orthopaedic Surgery | Admitting: Orthopaedic Surgery

## 2014-11-06 ENCOUNTER — Encounter (HOSPITAL_COMMUNITY): Payer: Self-pay | Admitting: *Deleted

## 2014-11-06 DIAGNOSIS — G252 Other specified forms of tremor: Secondary | ICD-10-CM | POA: Diagnosis present

## 2014-11-06 DIAGNOSIS — M1711 Unilateral primary osteoarthritis, right knee: Secondary | ICD-10-CM | POA: Diagnosis present

## 2014-11-06 DIAGNOSIS — E039 Hypothyroidism, unspecified: Secondary | ICD-10-CM | POA: Diagnosis present

## 2014-11-06 DIAGNOSIS — M810 Age-related osteoporosis without current pathological fracture: Secondary | ICD-10-CM | POA: Diagnosis present

## 2014-11-06 DIAGNOSIS — M25561 Pain in right knee: Secondary | ICD-10-CM | POA: Diagnosis present

## 2014-11-06 DIAGNOSIS — Z885 Allergy status to narcotic agent status: Secondary | ICD-10-CM | POA: Diagnosis not present

## 2014-11-06 DIAGNOSIS — T84038A Mechanical loosening of other internal prosthetic joint, initial encounter: Secondary | ICD-10-CM | POA: Diagnosis present

## 2014-11-06 DIAGNOSIS — K219 Gastro-esophageal reflux disease without esophagitis: Secondary | ICD-10-CM | POA: Diagnosis present

## 2014-11-06 DIAGNOSIS — F319 Bipolar disorder, unspecified: Secondary | ICD-10-CM | POA: Diagnosis present

## 2014-11-06 DIAGNOSIS — Y838 Other surgical procedures as the cause of abnormal reaction of the patient, or of later complication, without mention of misadventure at the time of the procedure: Secondary | ICD-10-CM | POA: Diagnosis present

## 2014-11-06 DIAGNOSIS — T84032A Mechanical loosening of internal right knee prosthetic joint, initial encounter: Secondary | ICD-10-CM | POA: Diagnosis present

## 2014-11-06 DIAGNOSIS — T84032S Mechanical loosening of internal right knee prosthetic joint, sequela: Secondary | ICD-10-CM

## 2014-11-06 DIAGNOSIS — G43909 Migraine, unspecified, not intractable, without status migrainosus: Secondary | ICD-10-CM | POA: Diagnosis present

## 2014-11-06 DIAGNOSIS — F17210 Nicotine dependence, cigarettes, uncomplicated: Secondary | ICD-10-CM | POA: Diagnosis present

## 2014-11-06 DIAGNOSIS — Z96659 Presence of unspecified artificial knee joint: Secondary | ICD-10-CM

## 2014-11-06 DIAGNOSIS — M1710 Unilateral primary osteoarthritis, unspecified knee: Secondary | ICD-10-CM | POA: Diagnosis present

## 2014-11-06 HISTORY — PX: TOTAL KNEE REVISION: SHX996

## 2014-11-06 SURGERY — TOTAL KNEE REVISION
Anesthesia: General | Site: Knee | Laterality: Right

## 2014-11-06 MED ORDER — FENTANYL CITRATE (PF) 250 MCG/5ML IJ SOLN
INTRAMUSCULAR | Status: AC
  Filled 2014-11-06: qty 5

## 2014-11-06 MED ORDER — GLYCOPYRROLATE 0.2 MG/ML IJ SOLN
INTRAMUSCULAR | Status: DC | PRN
  Administered 2014-11-06: 0.4 mg via INTRAVENOUS

## 2014-11-06 MED ORDER — RIVAROXABAN 10 MG PO TABS
10.0000 mg | ORAL_TABLET | Freq: Every day | ORAL | Status: DC
  Administered 2014-11-07 – 2014-11-08 (×2): 10 mg via ORAL
  Filled 2014-11-06 (×2): qty 1

## 2014-11-06 MED ORDER — LIDOCAINE HCL (CARDIAC) 20 MG/ML IV SOLN
INTRAVENOUS | Status: AC
  Filled 2014-11-06: qty 5

## 2014-11-06 MED ORDER — LACTATED RINGERS IV SOLN
INTRAVENOUS | Status: DC | PRN
  Administered 2014-11-06 (×2): via INTRAVENOUS

## 2014-11-06 MED ORDER — ONDANSETRON HCL 4 MG/2ML IJ SOLN
INTRAMUSCULAR | Status: AC
  Filled 2014-11-06: qty 2

## 2014-11-06 MED ORDER — DOCUSATE SODIUM 100 MG PO CAPS
100.0000 mg | ORAL_CAPSULE | Freq: Two times a day (BID) | ORAL | Status: DC
  Administered 2014-11-06 – 2014-11-08 (×5): 100 mg via ORAL
  Filled 2014-11-06 (×4): qty 1

## 2014-11-06 MED ORDER — HYDROMORPHONE HCL 1 MG/ML IJ SOLN: 0.5000 mg | INTRAMUSCULAR | Status: DC | PRN

## 2014-11-06 MED ORDER — SCOPOLAMINE 1 MG/3DAYS TD PT72
MEDICATED_PATCH | TRANSDERMAL | Status: DC | PRN
  Administered 2014-11-06: 1 via TRANSDERMAL

## 2014-11-06 MED ORDER — METOCLOPRAMIDE HCL 5 MG/ML IJ SOLN: 5.0000 mg | Freq: Three times a day (TID) | INTRAMUSCULAR | Status: DC | PRN

## 2014-11-06 MED ORDER — ACETAMINOPHEN 10 MG/ML IV SOLN
1000.0000 mg | Freq: Four times a day (QID) | INTRAVENOUS | Status: AC
  Administered 2014-11-06 – 2014-11-07 (×4): 1000 mg via INTRAVENOUS
  Filled 2014-11-06 (×4): qty 100

## 2014-11-06 MED ORDER — LITHIUM CARBONATE 300 MG PO CAPS
300.0000 mg | ORAL_CAPSULE | Freq: Two times a day (BID) | ORAL | Status: DC
  Administered 2014-11-06 – 2014-11-08 (×4): 300 mg via ORAL
  Filled 2014-11-06 (×6): qty 1

## 2014-11-06 MED ORDER — HYDROMORPHONE HCL 1 MG/ML IJ SOLN: 0.2500 mg | INTRAMUSCULAR | Status: DC | PRN

## 2014-11-06 MED ORDER — DULOXETINE HCL 60 MG PO CPEP
60.0000 mg | ORAL_CAPSULE | Freq: Every day | ORAL | Status: DC
  Administered 2014-11-07 – 2014-11-08 (×2): 60 mg via ORAL
  Filled 2014-11-06 (×2): qty 1

## 2014-11-06 MED ORDER — CEFAZOLIN SODIUM-DEXTROSE 2-3 GM-% IV SOLR
INTRAVENOUS | Status: AC
  Administered 2014-11-06: 2 g via INTRAVENOUS
  Filled 2014-11-06: qty 50

## 2014-11-06 MED ORDER — ONDANSETRON HCL 4 MG PO TABS: 4.0000 mg | ORAL_TABLET | Freq: Four times a day (QID) | ORAL | Status: DC | PRN

## 2014-11-06 MED ORDER — SODIUM CHLORIDE 0.9 % IR SOLN
Status: DC | PRN
Start: 2014-11-06 — End: 2014-11-06
  Administered 2014-11-06: 1000 mL

## 2014-11-06 MED ORDER — NEOSTIGMINE METHYLSULFATE 10 MG/10ML IV SOLN
INTRAVENOUS | Status: DC | PRN
  Administered 2014-11-06: 3 mg via INTRAVENOUS

## 2014-11-06 MED ORDER — CEFAZOLIN SODIUM-DEXTROSE 2-3 GM-% IV SOLR
2.0000 g | Freq: Four times a day (QID) | INTRAVENOUS | Status: AC
  Administered 2014-11-06: 2 g via INTRAVENOUS
  Filled 2014-11-06 (×2): qty 50

## 2014-11-06 MED ORDER — HYDROMORPHONE HCL 2 MG PO TABS
2.0000 mg | ORAL_TABLET | ORAL | Status: DC | PRN
  Administered 2014-11-06 – 2014-11-08 (×4): 2 mg via ORAL
  Filled 2014-11-06 (×5): qty 1

## 2014-11-06 MED ORDER — METHOCARBAMOL 1000 MG/10ML IJ SOLN
500.0000 mg | INTRAVENOUS | Status: DC
  Filled 2014-11-06: qty 5

## 2014-11-06 MED ORDER — SODIUM CHLORIDE 0.9 % IV SOLN
INTRAVENOUS | Status: DC
  Administered 2014-11-06: 18:00:00 via INTRAVENOUS

## 2014-11-06 MED ORDER — PHENOL 1.4 % MT LIQD: 1.0000 | OROMUCOSAL | Status: DC | PRN

## 2014-11-06 MED ORDER — LEVOTHYROXINE SODIUM 50 MCG PO TABS
50.0000 ug | ORAL_TABLET | Freq: Every day | ORAL | Status: DC
  Administered 2014-11-07 – 2014-11-08 (×2): 50 ug via ORAL
  Filled 2014-11-06 (×2): qty 1

## 2014-11-06 MED ORDER — ONDANSETRON HCL 4 MG/2ML IJ SOLN: 4.0000 mg | Freq: Four times a day (QID) | INTRAMUSCULAR | Status: DC | PRN

## 2014-11-06 MED ORDER — METOCLOPRAMIDE HCL 5 MG PO TABS: 5.0000 mg | ORAL_TABLET | Freq: Three times a day (TID) | ORAL | Status: DC | PRN

## 2014-11-06 MED ORDER — BUPIVACAINE-EPINEPHRINE 0.5% -1:200000 IJ SOLN
INTRAMUSCULAR | Status: DC | PRN
  Administered 2014-11-06: 30 mL

## 2014-11-06 MED ORDER — CHLORHEXIDINE GLUCONATE 4 % EX LIQD: 60.0000 mL | Freq: Once | CUTANEOUS | Status: DC

## 2014-11-06 MED ORDER — ALUM & MAG HYDROXIDE-SIMETH 200-200-20 MG/5ML PO SUSP: 30.0000 mL | ORAL | Status: DC | PRN

## 2014-11-06 MED ORDER — BISACODYL 10 MG RE SUPP: 10.0000 mg | Freq: Every day | RECTAL | Status: DC | PRN

## 2014-11-06 MED ORDER — ONDANSETRON HCL 4 MG/2ML IJ SOLN
INTRAMUSCULAR | Status: DC | PRN
  Administered 2014-11-06: 4 mg via INTRAVENOUS

## 2014-11-06 MED ORDER — FENTANYL CITRATE (PF) 100 MCG/2ML IJ SOLN
INTRAMUSCULAR | Status: DC | PRN
  Administered 2014-11-06 (×3): 50 ug via INTRAVENOUS
  Administered 2014-11-06: 100 ug via INTRAVENOUS
  Administered 2014-11-06 (×3): 50 ug via INTRAVENOUS

## 2014-11-06 MED ORDER — ROCURONIUM BROMIDE 100 MG/10ML IV SOLN
INTRAVENOUS | Status: DC | PRN
  Administered 2014-11-06: 50 mg via INTRAVENOUS

## 2014-11-06 MED ORDER — MAGNESIUM CITRATE PO SOLN: 1.0000 | Freq: Once | ORAL | Status: AC | PRN

## 2014-11-06 MED ORDER — MIDAZOLAM HCL 2 MG/2ML IJ SOLN
INTRAMUSCULAR | Status: AC
  Filled 2014-11-06: qty 2

## 2014-11-06 MED ORDER — PROMETHAZINE HCL 25 MG/ML IJ SOLN: 6.2500 mg | INTRAMUSCULAR | Status: DC | PRN

## 2014-11-06 MED ORDER — PRIMIDONE 50 MG PO TABS
200.0000 mg | ORAL_TABLET | Freq: Every day | ORAL | Status: DC
  Administered 2014-11-06 – 2014-11-07 (×2): 200 mg via ORAL
  Filled 2014-11-06 (×3): qty 4

## 2014-11-06 MED ORDER — BUPIVACAINE-EPINEPHRINE (PF) 0.5% -1:200000 IJ SOLN
INTRAMUSCULAR | Status: AC
  Filled 2014-11-06: qty 30

## 2014-11-06 MED ORDER — PROPOFOL 10 MG/ML IV BOLUS
INTRAVENOUS | Status: DC | PRN
  Administered 2014-11-06: 200 mg via INTRAVENOUS

## 2014-11-06 MED ORDER — DIPHENHYDRAMINE HCL 12.5 MG/5ML PO ELIX: 12.5000 mg | ORAL_SOLUTION | ORAL | Status: DC | PRN

## 2014-11-06 MED ORDER — METHOCARBAMOL 1000 MG/10ML IJ SOLN
500.0000 mg | Freq: Four times a day (QID) | INTRAVENOUS | Status: DC | PRN
  Filled 2014-11-06: qty 5

## 2014-11-06 MED ORDER — ROCURONIUM BROMIDE 50 MG/5ML IV SOLN
INTRAVENOUS | Status: AC
  Filled 2014-11-06: qty 1

## 2014-11-06 MED ORDER — VANCOMYCIN HCL 1000 MG IV SOLR
INTRAVENOUS | Status: AC
  Filled 2014-11-06: qty 1000

## 2014-11-06 MED ORDER — BUPIVACAINE-EPINEPHRINE (PF) 0.5% -1:200000 IJ SOLN
INTRAMUSCULAR | Status: DC | PRN
  Administered 2014-11-06: 30 mL via PERINEURAL

## 2014-11-06 MED ORDER — MIDAZOLAM HCL 5 MG/5ML IJ SOLN
INTRAMUSCULAR | Status: DC | PRN
  Administered 2014-11-06: 2 mg via INTRAVENOUS

## 2014-11-06 MED ORDER — MENTHOL 3 MG MT LOZG: 1.0000 | LOZENGE | OROMUCOSAL | Status: DC | PRN

## 2014-11-06 MED ORDER — POLYETHYLENE GLYCOL 3350 17 G PO PACK: 17.0000 g | PACK | Freq: Every day | ORAL | Status: DC | PRN

## 2014-11-06 MED ORDER — PROPOFOL 10 MG/ML IV BOLUS
INTRAVENOUS | Status: AC
  Filled 2014-11-06: qty 20

## 2014-11-06 MED ORDER — ACETAMINOPHEN 10 MG/ML IV SOLN
INTRAVENOUS | Status: AC
Start: 2014-11-06 — End: 2014-11-06
  Filled 2014-11-06: qty 100

## 2014-11-06 MED ORDER — VANCOMYCIN HCL 1000 MG IV SOLR
INTRAVENOUS | Status: DC | PRN
  Administered 2014-11-06: 1000 mg

## 2014-11-06 MED ORDER — METHOCARBAMOL 500 MG PO TABS
500.0000 mg | ORAL_TABLET | Freq: Four times a day (QID) | ORAL | Status: DC | PRN
  Administered 2014-11-06: 500 mg via ORAL
  Filled 2014-11-06: qty 1

## 2014-11-06 MED ORDER — SODIUM CHLORIDE 0.9 % IR SOLN
Status: DC | PRN
  Administered 2014-11-06: 3000 mL

## 2014-11-06 SURGICAL SUPPLY — 67 items
ADAPTER BOLT FEMORAL +2/-2 (Knees) ×3 IMPLANT
ANCHOR SUPER QUICK (Anchor) ×9 IMPLANT
BANDAGE ESMARK 6X9 LF (GAUZE/BANDAGES/DRESSINGS) ×1 IMPLANT
BLADE SAGITTAL 25.0X1.19X90 (BLADE) ×2 IMPLANT
BLADE SAGITTAL 25.0X1.19X90MM (BLADE) ×1
BLADE SAW SGTL 13.0X1.19X90.0M (BLADE) ×3 IMPLANT
BLADE SAW SGTL NAR THIN XSHT (BLADE) ×3 IMPLANT
BNDG ESMARK 6X9 LF (GAUZE/BANDAGES/DRESSINGS) ×3
BOWL SMART MIX CTS (DISPOSABLE) IMPLANT
CEMENT HV SMART SET (Cement) ×6 IMPLANT
COVER SURGICAL LIGHT HANDLE (MISCELLANEOUS) ×3 IMPLANT
CUFF TOURNIQUET SINGLE 34IN LL (TOURNIQUET CUFF) IMPLANT
CUFF TOURNIQUET SINGLE 44IN (TOURNIQUET CUFF) IMPLANT
DRAPE EXTREMITY T 121X128X90 (DRAPE) ×3 IMPLANT
DRAPE IMP U-DRAPE 54X76 (DRAPES) ×3 IMPLANT
DRSG ADAPTIC 3X8 NADH LF (GAUZE/BANDAGES/DRESSINGS) ×3 IMPLANT
DRSG PAD ABDOMINAL 8X10 ST (GAUZE/BANDAGES/DRESSINGS) ×3 IMPLANT
DURAPREP 26ML APPLICATOR (WOUND CARE) ×3 IMPLANT
ELECT REM PT RETURN 9FT ADLT (ELECTROSURGICAL) ×3
ELECTRODE REM PT RTRN 9FT ADLT (ELECTROSURGICAL) ×1 IMPLANT
EVACUATOR 1/8 PVC DRAIN (DRAIN) IMPLANT
FACESHIELD WRAPAROUND (MASK) ×6 IMPLANT
FEM TC3 RT PFC SIGMA SZ2.5 (Orthopedic Implant) ×3 IMPLANT
FEMORAL ADAPTER (Orthopedic Implant) ×3 IMPLANT
FEMORAL TC3 RT PFC SIGMA SZ2.5 (Orthopedic Implant) ×1 IMPLANT
GAUZE SPONGE 4X4 12PLY STRL (GAUZE/BANDAGES/DRESSINGS) ×3 IMPLANT
GLOVE BIOGEL PI IND STRL 8 (GLOVE) ×1 IMPLANT
GLOVE BIOGEL PI IND STRL 8.5 (GLOVE) IMPLANT
GLOVE BIOGEL PI INDICATOR 8 (GLOVE) ×2
GLOVE BIOGEL PI INDICATOR 8.5 (GLOVE)
GLOVE ECLIPSE 8.0 STRL XLNG CF (GLOVE) ×3 IMPLANT
GLOVE SURG ORTHO 8.5 STRL (GLOVE) ×3 IMPLANT
GOWN STRL REUS W/ TWL LRG LVL3 (GOWN DISPOSABLE) ×2 IMPLANT
GOWN STRL REUS W/ TWL XL LVL3 (GOWN DISPOSABLE) ×1 IMPLANT
GOWN STRL REUS W/TWL 2XL LVL3 (GOWN DISPOSABLE) ×3 IMPLANT
GOWN STRL REUS W/TWL LRG LVL3 (GOWN DISPOSABLE) ×4
GOWN STRL REUS W/TWL XL LVL3 (GOWN DISPOSABLE) ×2
HANDPIECE INTERPULSE COAX TIP (DISPOSABLE) ×2
INSERT TC3 TIBIAL SZ2.5 10.0 (Knees) ×3 IMPLANT
KIT BASIN OR (CUSTOM PROCEDURE TRAY) ×3 IMPLANT
KIT ROOM TURNOVER OR (KITS) ×3 IMPLANT
MANIFOLD NEPTUNE II (INSTRUMENTS) ×3 IMPLANT
NEEDLE 22X1 1/2 (OR ONLY) (NEEDLE) IMPLANT
NS IRRIG 1000ML POUR BTL (IV SOLUTION) ×3 IMPLANT
PACK TOTAL JOINT (CUSTOM PROCEDURE TRAY) ×3 IMPLANT
PACK UNIVERSAL I (CUSTOM PROCEDURE TRAY) ×3 IMPLANT
PAD ARMBOARD 7.5X6 YLW CONV (MISCELLANEOUS) ×6 IMPLANT
PAD CAST 4YDX4 CTTN HI CHSV (CAST SUPPLIES) ×1 IMPLANT
PADDING CAST COTTON 4X4 STRL (CAST SUPPLIES) ×2
PADDING CAST COTTON 6X4 STRL (CAST SUPPLIES) ×3 IMPLANT
PATELLA 3PEG METAL POLY (Knees) IMPLANT
PATELLA DOME PFC 32MM (Knees) ×3 IMPLANT
SET HNDPC FAN SPRY TIP SCT (DISPOSABLE) ×1 IMPLANT
STAPLER VISISTAT 35W (STAPLE) ×3 IMPLANT
STEM UNIV REV 115 X 6 FLUTED (Stem) ×3 IMPLANT
STEM UNIVERSAL REVISION 75X14 (Stem) ×3 IMPLANT
SUCTION FRAZIER TIP 10 FR DISP (SUCTIONS) ×3 IMPLANT
SUT BONE WAX W31G (SUTURE) ×3 IMPLANT
SUT ETHIBOND NAB CT1 #1 30IN (SUTURE) ×12 IMPLANT
SUT VIC AB 0 CT1 27 (SUTURE) ×2
SUT VIC AB 0 CT1 27XBRD ANBCTR (SUTURE) ×1 IMPLANT
SUT VIC AB 2-0 FS1 27 (SUTURE) ×6 IMPLANT
SYR CONTROL 10ML LL (SYRINGE) IMPLANT
TOWER CARTRIDGE SMART MIX (DISPOSABLE) ×3 IMPLANT
TRAY FOLEY CATH 16FRSI W/METER (SET/KITS/TRAYS/PACK) ×3 IMPLANT
TRAY REVISION SZ 3 (Knees) ×3 IMPLANT
WATER STERILE IRR 1000ML POUR (IV SOLUTION) ×9 IMPLANT

## 2014-11-06 NOTE — Transfer of Care (Signed)
Immediate Anesthesia Transfer of Care Note  Patient: Kathryn StallsDebra L C Jones  Procedure(s) Performed: Procedure(s): TOTAL KNEE REVISION (Right)  Patient Location: PACU  Anesthesia Type:General and Regional  Level of Consciousness: awake, alert  and oriented  Airway & Oxygen Therapy: Patient Spontanous Breathing and Patient connected to face mask oxygen  Post-op Assessment: Report given to RN and Post -op Vital signs reviewed and stable  Post vital signs: Reviewed and stable  Last Vitals:  Filed Vitals:   11/06/14 0603  BP: 114/45  Pulse: 72  Temp: 36.2 C  Resp: 20    Complications: No apparent anesthesia complications

## 2014-11-06 NOTE — Anesthesia Postprocedure Evaluation (Signed)
Anesthesia Post Note  Patient: [name]  Procedure(s) Performed: Procedure(s) (LRB): TOTAL KNEE REVISION (Right)  Anesthesia type: general  Patient location: PACU  Post pain: Pain level controlled  Post assessment: Patient's Cardiovascular Status Stable  Last Vitals:  Filed Vitals:   11/06/14 1200  BP:   Pulse:   Temp: 36.7 C  Resp:     Post vital signs: Reviewed and stable  Level of consciousness: sedated  Complications: No apparent anesthesia complications

## 2014-11-06 NOTE — H&P (Signed)
  The recent History & Physical has been reviewed. I have personally examined the patient today. There is no interval change to the documented History & Physical. The patient would like to proceed with the procedure.  Norlene CampbellWHITFIELD, Shubh Chiara W 11/06/2014,  7:08 AM

## 2014-11-06 NOTE — Progress Notes (Signed)
Orthopedic Tech Progress Note Patient Details:  Kathryn StallsDebra L C Calvin Dec 24, 1955 [id number] CPM applied to RLE with appropriate settings. OHF applied to bed. CPM Right Knee CPM Right Knee: On Right Knee Flexion (Degrees): 90 Right Knee Extension (Degrees): 0   Asia R Thompson 11/06/2014, 11:43 AM

## 2014-11-06 NOTE — Progress Notes (Signed)
Orthopedic Tech Progress Note Patient Details:  Kathryn StallsDebra L C Jones [birth year] [id number]  Patient ID: [name], female   DOB: [birth year], 59 y.o.   MRN: [id number] Placed pt's rle on cpm @0 -90 degrees @2000   Nikki DomCrawford, [name] 11/06/2014, 7:53 PM

## 2014-11-06 NOTE — Anesthesia Preprocedure Evaluation (Addendum)
Anesthesia Evaluation  Patient identified by MRN, date of birth, ID band Patient awake    Reviewed: Allergy & Precautions, NPO status , Patient's Chart, lab work & pertinent test results  History of Anesthesia Complications Negative for: history of anesthetic complications  Airway Mallampati: II  TM Distance: >3 FB Neck ROM: Full    Dental  (+) Poor Dentition, Dental Advisory Given   Pulmonary Current Smoker,    Pulmonary exam normal       Cardiovascular negative cardio ROS Normal cardiovascular exam    Neuro/Psych  Headaches, PSYCHIATRIC DISORDERS Depression Bipolar Disorder    GI/Hepatic Neg liver ROS, GERD-  ,  Endo/Other  Hypothyroidism   Renal/GU negative Renal ROS     Musculoskeletal  (+) Arthritis -,   Abdominal   Peds  Hematology   Anesthesia Other Findings   Reproductive/Obstetrics                            Anesthesia Physical Anesthesia Plan  ASA: III  Anesthesia Plan: General   Post-op Pain Management:    Induction: Intravenous  Airway Management Planned: LMA  Additional Equipment:   Intra-op Plan:   Post-operative Plan: Extubation in OR  Informed Consent: I have reviewed the patients History and Physical, chart, labs and discussed the procedure including the risks, benefits and alternatives for the proposed anesthesia with the patient or authorized representative who has indicated his/her understanding and acceptance.   Dental advisory given  Plan Discussed with: Anesthesiologist, CRNA and Surgeon  Anesthesia Plan Comments:         Anesthesia Quick Evaluation

## 2014-11-06 NOTE — Evaluation (Signed)
Physical Therapy Evaluation Patient Details Name: Kathryn StallsDebra L C Jones MRN: [id number] DOB: [birth year] Today's Date: 11/06/2014   History of Present Illness  Patient is a 59 y/o female s/p right total knee revision. PMH of bipolar disorder, migraine, depression, osteoporosis and hypothyroidism.  Clinical Impression  Patient presents with pain, lethargy and post surgical deficits RLE s/p above surgery impacting mobility. Education provided on knee precautions, WB status and HEP. Pt lethargic (suspect due to medications/anesthesia) limiting ambulation distance due to safety. Tolerated transfer to chair and there ex. Would benefit from skilled PT to improve overall functional mobility prior to return home.    Follow Up Recommendations Home health PT;Supervision/Assistance - 24 hour    Equipment Recommendations  None recommended by PT    Recommendations for Other Services       Precautions / Restrictions Precautions Precautions: Knee Precaution Booklet Issued: No Precaution Comments: Reviewed precautions, no pillow under knee. Restrictions Weight Bearing Restrictions: Yes RLE Weight Bearing: Partial weight bearing RLE Partial Weight Bearing Percentage or Pounds: 50%      Mobility  Bed Mobility Overal bed mobility: Needs Assistance Bed Mobility: Supine to Sit     Supine to sit: Min assist;HOB elevated     General bed mobility comments: Min A to mobilize RLE to EOB. Use of rails.  Transfers Overall transfer level: Needs assistance Equipment used: Rolling walker (2 wheeled) Transfers: Sit to/from Stand Sit to Stand: Min assist         General transfer comment: Min A to boost from EOB to standing. Cues for PWB through RLE. Lethargic.  Ambulation/Gait Ambulation/Gait assistance: Min assist Ambulation Distance (Feet): 8 Feet Assistive device: Rolling walker (2 wheeled) Gait Pattern/deviations: Step-to pattern;Decreased stance time - right;Decreased step length - left;Trunk  flexed;Shuffle   Gait velocity interpretation: Below normal speed for age/gender General Gait Details: Min A for balance due to lethargy; cues for PWB through RLE. Dyspnea present and drop in Sa02 on RA to mid 80s.  Stairs            Wheelchair Mobility    Modified Rankin (Stroke Patients Only)       Balance Overall balance assessment: Needs assistance Sitting-balance support: Feet supported;No upper extremity supported Sitting balance-Leahy Scale: Fair     Standing balance support: During functional activity Standing balance-Leahy Scale: Poor Standing balance comment: Relient on RW for support and to maintain PWB status RLE.                             Pertinent Vitals/Pain Pain Assessment: Faces Faces Pain Scale: Hurts little more Pain Location: right knee with movement Pain Descriptors / Indicators: Sore Pain Intervention(s): Monitored during session;Repositioned;Premedicated before session    Home Living Family/patient expects to be discharged to:: Private residence Living Arrangements: Spouse/significant other;Children Available Help at Discharge: Family;Available 24 hours/day Type of Home: House Home Access: Stairs to enter Entrance Stairs-Rails: Can reach both;Left;Right Entrance Stairs-Number of Steps: 5 Home Layout: One level Home Equipment: Walker - 2 wheels;Bedside commode      Prior Function Level of Independence: Independent         Comments: Works as a Engineer, sitemedical assistant.     Hand Dominance        Extremity/Trunk Assessment   Upper Extremity Assessment: Defer to OT evaluation           Lower Extremity Assessment: RLE deficits/detail RLE Deficits / Details: Limited AROM hip, knee secondary to pain and surgery.  Communication   Communication: No difficulties  Cognition Arousal/Alertness: Lethargic;Suspect due to medications Behavior During Therapy: [facility] for tasks assessed/performed Overall Cognitive Status:  Within Functional Limits for tasks assessed                      General Comments General comments (skin integrity, edema, etc.): Pt's daughter and husband present during session.    Exercises Total Joint Exercises Ankle Circles/Pumps: Both;15 reps;Supine Quad Sets: Both;10 reps;Supine Long Arc Quad: AAROM;Right;5 reps;Seated      Assessment/Plan    PT Assessment Patient needs continued PT services  PT Diagnosis Difficulty walking;Acute pain;Generalized weakness   PT Problem List Decreased strength;Pain;Cardiopulmonary status limiting activity;Decreased range of motion;Decreased cognition;Decreased activity tolerance;Decreased balance;Decreased mobility  PT Treatment Interventions Balance training;Gait training;Stair training;Functional mobility training;Therapeutic activities;Therapeutic exercise;Patient/family education   PT Goals (Current goals can be found in the Care Plan section) Acute Rehab PT Goals Patient Stated Goal: none stated too lethargic PT Goal Formulation: With patient Time For Goal Achievement: 11/20/14 Potential to Achieve Goals: Fair    Frequency BID   Barriers to discharge        Co-evaluation               End of Session Equipment Utilized During Treatment: Gait belt Activity Tolerance: Patient limited by lethargy;Patient tolerated treatment well Patient left: in chair;with call bell/phone within reach;with family/visitor present           Time: [id number] PT Time Calculation (min) (ACUTE ONLY): 23 min   Charges:   PT Evaluation $Initial PT Evaluation Tier I: 1 Procedure PT Treatments $Therapeutic Activity: 8-22 mins   PT G Codes:        Madalynne Gutmann A Baltazar Pekala 11/06/2014, 4:28 PM  ,Mylo RedShauna Eriona Kinchen, PT, DPT [phone]

## 2014-11-06 NOTE — Op Note (Signed)
PATIENT ID:      Kathryn StallsDebra L C Jones  MRN:     [id number] DOB/AGE:    Sep 12, 1955 / 59 y.o.       OPERATIVE REPORT    DATE OF PROCEDURE:  11/06/2014       PREOPERATIVE DIAGNOSIS:   LOOSENED RIGHT TOTAL KNEE ARTHROPLASTY                                                       Estimated body mass index is 24.49 kg/(m^2) as calculated from the following:   Height as of 10/29/14: 5\' 6"  (1.676 m).   Weight as of this encounter: 68.805 kg (151 lb 11 oz).     POSTOPERATIVE DIAGNOSIS:   LOOSENED RIGHT TOTAL KNEE ARTHROPLASTY                                                                     Estimated body mass index is 24.49 kg/(m^2) as calculated from the following:   Height as of 10/29/14: 5\' 6"  (1.676 m).   Weight as of this encounter: 68.805 kg (151 lb 11 oz).     PROCEDURE:  Procedure(s):RIGHT TOTAL KNEE REVISION      SURGEON:  Norlene CampbellPeter Braleigh Massoud, MD    ASSISTANT:   Jacqualine CodeBrian Petrarca, PA-C   (Present and scrubbed throughout the case, critical for assistance with exposure, retraction, instrumentation, and closure.)          ANESTHESIA: regional and general     DRAINS: (RIGHT KNEE) Hemovact drain(s) in the CLAMPED with  Suction Clamped :      TOURNIQUET TIME: * Missing tourniquet times found for documented tourniquets in log:  [id number] * Total Tourniquet Time Documented: Thigh (Right) - 110 minutes Total: Thigh (Right) - 110 minutes     COMPLICATIONS:  None   CONDITION:  stable  PROCEDURE IN DETAIL: [id number]   [name] 11/06/2014, 10:18 AM

## 2014-11-06 NOTE — Anesthesia Procedure Notes (Addendum)
Procedure Name: Intubation Date/Time: 11/06/2014 7:27 AM Performed by: Dairl PonderJIANG, FUDAN Pre-anesthesia Checklist: Patient identified, Timeout performed, Emergency Drugs available, Suction available and Patient being monitored Patient Re-evaluated:Patient Re-evaluated prior to inductionOxygen Delivery Method: Circle system utilized Preoxygenation: Pre-oxygenation with 100% oxygen Intubation Type: IV induction Ventilation: Mask ventilation without difficulty and Oral airway inserted - appropriate to patient size Laryngoscope Size: Mac and 3 Grade View: Grade III Tube type: Oral Tube size: 7.5 mm Number of attempts: 2 Airway Equipment and Method: Bougie stylet Placement Confirmation: breath sounds checked- equal and bilateral and positive ETCO2 Secured at: 22 cm Tube secured with: Tape Dental Injury: Teeth and Oropharynx as per pre-operative assessment  Difficulty Due To: Difficult Airway- due to anterior larynx   Anesthesia Regional Block:  Adductor canal block  Pre-Anesthetic Checklist: ,, timeout performed, Correct Patient, Correct Site, Correct Laterality, Correct Procedure, Correct Position, site marked, Risks and benefits discussed,  Surgical consent,  Pre-op evaluation,  At surgeon's request and post-op pain management  Laterality: Right  Prep: chloraprep       Needles:  Injection technique: Single-shot  Needle Type: Stimulator Needle - 80     Needle Length: 10cm 10 cm Needle Gauge: 21 and 21 G    Additional Needles:  Procedures: ultrasound guided (picture in chart) Adductor canal block Narrative:  Start time: 11/06/2014 7:13 AM End time: 11/06/2014 7:03 AM Injection made incrementally with aspirations every 5 mL.  Performed by: Personally

## 2014-11-07 ENCOUNTER — Encounter (HOSPITAL_COMMUNITY): Payer: Self-pay | Admitting: Orthopaedic Surgery

## 2014-11-07 LAB — CBC
HCT: 36.9 % (ref 36.0–46.0)
Hemoglobin: 11.4 g/dL — ABNORMAL LOW (ref 12.0–15.0)
MCH: 28.7 pg (ref 26.0–34.0)
MCHC: 30.9 g/dL (ref 30.0–36.0)
MCV: 92.9 fL (ref 78.0–100.0)
PLATELETS: 233 10*3/uL (ref 150–400)
RBC: 3.97 MIL/uL (ref 3.87–5.11)
RDW: 14.3 % (ref 11.5–15.5)
WBC: 12.7 10*3/uL — ABNORMAL HIGH (ref 4.0–10.5)

## 2014-11-07 LAB — BASIC METABOLIC PANEL
ANION GAP: 6 (ref 5–15)
BUN: 6 mg/dL (ref 6–20)
CHLORIDE: 107 mmol/L (ref 101–111)
CO2: 24 mmol/L (ref 22–32)
CREATININE: 0.65 mg/dL (ref 0.44–1.00)
Calcium: 8.4 mg/dL — ABNORMAL LOW (ref 8.9–10.3)
GFR calc non Af Amer: >60 mL/min (ref >60)
Glucose, Bld: 114 mg/dL — ABNORMAL HIGH (ref 65–99)
Potassium: 4.1 mmol/L (ref 3.5–5.1)
SODIUM: 137 mmol/L (ref 135–145)

## 2014-11-07 NOTE — Progress Notes (Signed)
Orthopedic Tech Progress Note Patient Details:  Kathryn StallsDebra L C Jones [birth year] [id number]  Patient ID: [name], female   DOB: [birth year], 59 y.o.   MRN: [id number] Placed pt's rle on cpm @ 0-70 degrees; will increase as pt tolerates; RN notified  Nikki DomCrawford, Simar Pothier 11/07/2014, 3:04 PM

## 2014-11-07 NOTE — Progress Notes (Signed)
Physical Therapy Treatment Patient Details Name: Kathryn StallsDebra L C Jones MRN: [id number] DOB: 22-Mar-1956 Today's Date: 11/07/2014    History of Present Illness Patient is a 59 y/o female s/p right total knee revision. PMH of bipolar disorder, migraine, depression, osteoporosis and hypothyroidism.    PT Comments    Patient progressing slowly towards PT goals. Increased pain with knee AROM today. Tolerated short distance ambulation with cues to [facility]. Reviewed handout and HEP. Encouraged OOB for all meals. Will follow up in PM for gait training.   Follow Up Recommendations  Home health PT;Supervision/Assistance - 24 hour     Equipment Recommendations  None recommended by PT    Recommendations for Other Services       Precautions / Restrictions Precautions Precautions: Knee Precaution Booklet Issued: Yes (comment) Precaution Comments: Reviewed no pillow under knee and precautions. Restrictions Weight Bearing Restrictions: Yes RLE Weight Bearing: Partial weight bearing RLE Partial Weight Bearing Percentage or Pounds: 50%    Mobility  Bed Mobility Overal bed mobility: Needs Assistance Bed Mobility: Sit to Supine     Supine to sit: Min assist;HOB elevated Sit to supine: Min assist;HOB elevated   General bed mobility comments: Min A to bring RLE into bed.   Transfers Overall transfer level: Needs assistance Equipment used: Rolling walker (2 wheeled) Transfers: Sit to/from Stand Sit to Stand: Min assist Stand pivot transfers: Min assist       General transfer comment: Min A to rise from recliner x1, from toilet x1 with cues for hand placement. Uncontrolled descent into chair. Needs cues to keep RW with her under getting to desired surface - tends to push it awayt oo early.  Ambulation/Gait Ambulation/Gait assistance: Min guard Ambulation Distance (Feet): 40 Feet Assistive device: Rolling walker (2 wheeled) Gait Pattern/deviations: Step-to pattern;Decreased stance  time - right;Decreased step length - left;Trunk flexed   Gait velocity interpretation: Below normal speed for age/gender General Gait Details: Cues to adhere to PWB RLE. Standing rest breaks due to fatigue. Dyspnea present. Increased knee flexion RLE throughout gait cycle.   Stairs            Wheelchair Mobility    Modified Rankin (Stroke Patients Only)       Balance Overall balance assessment: Needs assistance Sitting-balance support: Feet supported;No upper extremity supported Sitting balance-Leahy Scale: Fair     Standing balance support: During functional activity Standing balance-Leahy Scale: Poor Standing balance comment: needs rw to maintain PWB status                    Cognition Arousal/Alertness: Awake/alert Behavior During Therapy: WFL for tasks assessed/performed Overall Cognitive Status: Within Functional Limits for tasks assessed                      Exercises Total Joint Exercises Ankle Circles/Pumps: Both;15 reps;Seated Quad Sets: Right;5 reps;Supine Towel Squeeze: Both;5 reps;Seated Hip ABduction/ADduction: AAROM;Right;10 reps;Supine Long Arc Quad: AAROM;Right;5 reps;Seated Goniometric ROM: -13-60 degrees knee AROM.    General Comments General comments (skin integrity, edema, etc.): pt's husband present during session.      Pertinent Vitals/Pain Pain Assessment: Faces Pain Score: 2  Faces Pain Scale: Hurts whole lot Pain Location: right knee with movement. Pain Descriptors / Indicators: Sore;Aching;Burning Pain Intervention(s): Limited activity within patient's tolerance;Monitored during session;Repositioned    Home Living Family/patient expects to be discharged to:: Private residence Living Arrangements: Spouse/significant other Available Help at Discharge: Family;Available 24 hours/day Type of Home: House Home Access:  Stairs to enter Entrance Stairs-Rails: Can reach both;Left;Right Home Layout: One level Home  Equipment: Walker - 2 wheels;Bedside commode;Shower seat      Prior Function Level of Independence: Independent      Comments: Works as a Engineer, sitemedical assistant.   PT Goals (current goals can now be found in the care plan section) Acute Rehab PT Goals Patient Stated Goal: not stated Progress towards PT goals: Progressing toward goals    Frequency  BID    PT Plan Current plan remains appropriate    Co-evaluation             End of Session Equipment Utilized During Treatment: Gait belt Activity Tolerance: Patient limited by pain Patient left: in bed;with call bell/phone within reach;with bed alarm set;with family/visitor present     Time: 1103-1130 PT Time Calculation (min) (ACUTE ONLY): 27 min  Charges:  $Gait Training: 8-22 mins $Therapeutic Exercise: 8-22 mins                    G Codes:      Maddyx Wieck A Amariana Mirando 11/07/2014, 11:45 AM Mylo RedShauna Kamel Haven, PT, DPT [phone]

## 2014-11-07 NOTE — Care Management Note (Signed)
Case Management Note  Patient Details  Name: [name] MRN: [id number] Date of Birth: 1956/04/25  Subjective/Objective:    Patient admitted with mechanical loosening of right internal knee. Patient underwent a right total knee revision.                 Action/Plan:    Case manager spoke with patient concerning home health and DME. [facility] no [facility].Patient states she has rolling walker and 3in1. CPM to be delivered. Has family support at discharge.              Expected Discharge Date: 10/1914                 [facility]    In-House Referral:  NA  Discharge planning Services  CM Consult  Post Acute Care Choice:  Home Health Choice offered to:  Patient  DME Arranged:  CPM DME Agency:  [employer]  HH Arranged:  PT [facility]  Status of Service:  Completed, signed off  Medicare Important Message Given:    Date Medicare IM Given:    Medicare IM give by:    Date Additional Medicare IM Given:    Additional Medicare Important Message give by:     If discussed at Long Length of Stay Meetings, dates discussed:    Additional Comments:  Durenda GuthrieBrady, Morna Flud Naomi, RN 11/07/2014, 1:12 PM

## 2014-11-07 NOTE — Op Note (Signed)
[name], [name]                  ACCOUNT NO.:  [id number]  MEDICAL RECORD NO.:  [id number]  LOCATION:  5N06C                        FACILITY:  [place]  PHYSICIAN:  Vonna Kotyk. Cris Talavera, M.D.DATE OF BIRTH:  1956-01-01  DATE OF PROCEDURE:  11/06/2014 DATE OF DISCHARGE:                              OPERATIVE REPORT   PREOPERATIVE DIAGNOSIS:  Painful, loosened right total knee replacement.  POSTOPERATIVE DIAGNOSIS:  Painful, loosened right total knee replacement.  PROCEDURE:  Revision right total knee replacement.  SURGEON:  Vonna Kotyk. Durward Fortes, M.D.  ASSISTANT:  Aaron Edelman D. Petrarca, PA-C.  ANESTHESIA:  General with supplemental femoral nerve block.  COMPLICATIONS:  None.  COMPONENTS:  A #3 MBT revision rotating tibial tray with a 75 mm long x 14 mm wide fluted stem.  On the femur, I used a size 2.5 with 115 mm long stem, 16 mm wide.  I used an oval-domed patella 32 mm with 3 pegs, all secured with polymethyl methacrylate with added vancomycin.  DESCRIPTION OF PROCEDURE:  [name] was met in the holding area, identified the right knee as the appropriate operative site.  She had received a femoral nerve block per Anesthesia.  The patient was then transported to room #7 where she was placed under general anesthesia without difficulty.  Prior to the patient coming into the room, I had identified the right lower extremity as the appropriate operative site and signed it accordingly.  The right lower extremity was then placed in a thigh tourniquet.  The leg was prepped with chlorhexidine scrub and DuraPrep x2.  Sterile draping was performed.  Time-out was called.  Right lower extremity was elevated with an Esmarch, exsanguinated with a proximal tourniquet at 350 mmHg.  The prior midline longitudinal incision was utilized and via sharp dissection, carried down to subcutaneous tissue.  First layer of capsule was incised in the midline.  There was abundant scar tissue which  was incised in the medial parapatellar area through the deep capsule.  Using the Bovie, the joint was entered.  There was a relatively clear yellow joint effusion, this was sent for aerobic and anaerobic cultures. Preoperative cultures were both anaerobes and aerobes, were negative. The patient also had a normal sed rate and C-reactive protein.  The joint was then opened and the patella was everted to 180 degrees. There was just a partial avulsion of the patellar tendon.  This was subsequently fixed at the end of the procedure with Mitek anchors.  There did not appear to be a beefy red synovitis and the joint did not look infected.  I did send 2 specimens of deep synovium to the pathologist and the reading was, no evidence of increased white cells nor there was any evidence of infection.  Accordingly, we proceeded with a single stage revision.  The tibia and the femoral components were removed.  We used a combination of an oscillating saw and a thin osteotome.  The femur appeared to be well positioned and secured despite the positive bone scan for increased uptake in both the femur and the tibia.  The tibia was then removed.  There was minimal bone loss and then using the rongeur and the  osteotome, any remaining methacrylate was removed.  The stem of the polyethylene component was amputated with an osteotome and then using the same instruments, i.e. the thin osteotome and the oscillating saw, the tibial tray was then easily removed.  I then spent approximately 15 minutes, carefully removing the methacrylate stem in the tibia without any complications and thought I had removed all of the methacrylate.  At that point, the wound was irrigated with saline solution.  We proceeded initially with the tibial side.  I hand reamed to 14 mm and then measured a #3 MBT revision tray.  This was placed on the tibia and checked to be sure the alignment was appropriate.  This was pinned in place.   The center hole was then made to accommodate the 75 mm long stem.  After the appropriate reaming, the tibial tray would fit nicely on the tibia.  We checked the alignment and inserted the keeled portion of the construct.  I removed any soft tissue from the periphery of the tibia.  MCL and LCL remained intact.  There was abundant scar posterior which I also removed.  Preparation of the femur was then performed.  I hand reamed to 16 mm with good purchase and then used the anterior and posterior jigs.  Both anteriorly and posteriorly, did not need any enhancement as the trial femur fitted nicely against the femur anteriorly, posteriorly.  We then used the jig to obtain the center cut for TC3 femoral component.  Tapering cuts were also made. At that point, we constructed a trial femur with 115 mm long stem, 16 mm wide attached to the 2.5 femur, then inserted that.  It was an excellent fit.  There were no gaps.  We then inserted the trial tibial tray and a 12.5 mm polyethylene bridging bearing.  The construct was completely reduced and through a full range of motion, we had full extension, flexed over 115 degrees.  There was no instability with varus or valgus stress.  I then removed the existing patella using a small oscillating saw blade and the osteotome.  The 3 holes that remained were perfectly adequate to insert the patella button.  The button was then applied and this was reduced and through a full range of motion, it remained perfectly stable.  At that point, the tourniquet had been elevated for 110 minutes.  We removed all of the trial components and deflated the tourniquet for a total of 10 minutes, during which time, we irrigated the wound and obtained hemostasis.  At 10 minutes, we then elevated the leg and re-Esmarch'd the leg and tourniquet'd at 350 mmHg.  The wound was again irrigated with saline solution.  We proceeded to insert the final components that had been  reconstructed, brought on the table.  This included #3 rotating MBT revision tibial tray with 75 mm long, 14 mm wide fluted stem.  I glued the proximal portion of the component, but not the fluted stem, impacted this with excellent position.  The constructed femur was then impacted with methacrylate, i.e. the #2.5 with 115 mm long fluted stem, 16 mm wide.  This was also impacted with methacrylate in excellent position.  We removed all extraneous methacrylate from the periphery of the components.  We then inserted the 12.5 mm polyethylene bearing and placed the knee in full extension.  We found some further extraneous methacrylate which were removed with the [facility].  The patella was applied with a patellar clamp and methacrylate.  I added 1  g of vancomycin to the methacrylate.  At approximately 16 minutes of methacrylate matured, during which time, we injected the joint with 0.25% Marcaine with epinephrine.  The second tourniquet was then deflated for 20 minutes.  We had nice capillary refill to the joint.  Any gross bleeders were Bovie coagulated.  Medium-size Hemovac was inserted.  Wound was again irrigated with saline solution.  I used 3 Mitek anchors to secure the patellar tendon tended to partially avulsed tibia.  I then closed the deep capsule with #1 Ethibond and the wound was again irrigated with saline solution.  The deep capsule closed with running 0 Vicryl, subcu with 4-0, 3-0 Monocryl.  Skin closed with skin clips.  Sterile bulky dressing was applied followed by the patient's support stocking.  The patient tolerated the procedure without complications.     Vonna Kotyk. Durward Fortes, M.D.     PWW/MEDQ  D:  11/06/2014  T:  11/06/2014  Job:  [id number]

## 2014-11-07 NOTE — Progress Notes (Signed)
Physical Therapy Treatment Patient Details Name: Kathryn StallsDebra L C Fluegge MRN: [id number] DOB: [birth year] Today's Date: 11/07/2014    History of Present Illness Patient is a 59 y/o female s/p right total knee revision. PMH of bipolar disorder, migraine, depression, osteoporosis and hypothyroidism.    PT Comments    Patient progressing slowly with mobility. Continues to require Min A for transfers and mobility for safety. Requires cues to adhere to [facility] precautions RLE. Continues to be lethargic from pain medication. Will plan for stair negotiation next session as tolerated to prepare for d/c.   Follow Up Recommendations  Home health PT;Supervision/Assistance - 24 hour     Equipment Recommendations  None recommended by PT    Recommendations for Other Services       Precautions / Restrictions Precautions Precautions: Knee Precaution Booklet Issued: Yes (comment) Precaution Comments: Reviewed no pillow under knee and precautions. Restrictions Weight Bearing Restrictions: Yes RLE Weight Bearing: Partial weight bearing RLE Partial Weight Bearing Percentage or Pounds: 50%    Mobility  Bed Mobility Overal bed mobility: Needs Assistance Bed Mobility: Supine to Sit     Supine to sit: Min assist;HOB elevated     General bed mobility comments: Min A to bring RLE to EOB.   Transfers Overall transfer level: Needs assistance Equipment used: Rolling walker (2 wheeled) Transfers: Sit to/from Stand Sit to Stand: Min assist         General transfer comment: Min A to rise from EOB x1, from toilet x1 with cues for hand placement.   Ambulation/Gait Ambulation/Gait assistance: Min guard Ambulation Distance (Feet): 75 Feet Assistive device: Rolling walker (2 wheeled) Gait Pattern/deviations: Step-to pattern;Decreased stance time - right;Decreased step length - left;Trunk flexed   Gait velocity interpretation: Below normal speed for age/gender General Gait Details: Cues to adhere to PWB  RLE. Standing rest breaks due to fatigue. Increased knee flexion RLE throughout gait cycle.   Stairs            Wheelchair Mobility    Modified Rankin (Stroke Patients Only)       Balance Overall balance assessment: Needs assistance Sitting-balance support: Feet supported;No upper extremity supported Sitting balance-Leahy Scale: Fair     Standing balance support: During functional activity Standing balance-Leahy Scale: Poor                      Cognition Arousal/Alertness: Awake/alert Behavior During Therapy: WFL for tasks assessed/performed Overall Cognitive Status: Within Functional Limits for tasks assessed                      Exercises Total Joint Exercises Ankle Circles/Pumps: Both;15 reps;Supine Quad Sets: Right;10 reps;Seated Long Arc Quad: Right;AAROM;10 reps;Seated Knee Flexion: AAROM;Right;5 reps;Seated    General Comments        Pertinent Vitals/Pain Pain Assessment: Faces Faces Pain Scale: Hurts little more Pain Location: right knee with movement. Pain Descriptors / Indicators: Sore;Aching Pain Intervention(s): Limited activity within patient's tolerance;Monitored during session;Repositioned    Home Living                      Prior Function            PT Goals (current goals can now be found in the care plan section) Progress towards PT goals: Progressing toward goals    Frequency  BID    PT Plan Current plan remains appropriate    Co-evaluation  End of Session Equipment Utilized During Treatment: Gait belt Activity Tolerance: Patient tolerated treatment well;Patient limited by lethargy Patient left: in chair;with call bell/phone within reach;with family/visitor present     Time: [id number] PT Time Calculation (min) (ACUTE ONLY): 23 min  Charges:  $Gait Training: 8-22 mins $Therapeutic Exercise: 8-22 mins                    G Codes:      Carlen Fils A Addilyne Backs 11/07/2014, 5:14  PM Mylo RedShauna Giavanni Zeitlin, PT, DPT [phone]

## 2014-11-07 NOTE — Progress Notes (Signed)
Patient ID: [name], female   DOB: 02/15/1956, 59 y.o.   MRN: [id number] PATIENT ID: [name]        MRN:  [id number]          DOB/AGE: 02/15/1956 / 59 y.o.    Kathryn CampbellPeter Whitfield, MD   Jacqualine CodeBrian Petrarca, PA-C [address] [place], [place]  [id number]                             [phone]   PROGRESS NOTE  Subjective:  negative for Chest Pain  negative for Shortness of Breath  negative for Nausea/Vomiting   negative for Calf Pain    Tolerating Diet: yes         Patient reports pain as mild.     "sleepy"-probably relate to anesthesia and dilaudid  Objective: Vital signs in last 24 hours:   Patient Vitals for the past 24 hrs:  BP Temp Pulse Resp SpO2  11/07/14 0546 (!) 107/55 mmHg 98.5 F (36.9 C) 79 16 99 %  11/07/14 0145 (!) 101/45 mmHg 98.5 F (36.9 C) 70 16 99 %  11/06/14 2001 (!) 112/55 mmHg 98 F (36.7 C) 70 16 100 %  11/06/14 1230 133/60 mmHg 97.2 F (36.2 C) 60 16 100 %  11/06/14 1200 - 98.1 F (36.7 C) - - -  11/06/14 1152 (!) 131/56 mmHg - 66 10 100 %  11/06/14 1137 (!) 138/53 mmHg - 63 10 100 %  11/06/14 1122 (!) 130/57 mmHg - 67 10 100 %  11/06/14 1107 (!) 132/50 mmHg - 78 13 100 %  11/06/14 1052 (!) 140/59 mmHg 97.6 F (36.4 C) 70 12 100 %      Intake/Output from previous day:   05/17 0701 - 05/18 0700 In: 3141.3 [P.O.:320; I.V.:2521.3] Out: 2325 [Urine:2225]   Intake/Output this shift:       Intake/Output      05/17 0701 - 05/18 0700 05/18 0701 - 05/19 0700   P.O. 320    I.V. (mL/kg) 2521.3 (36.6)    IV Piggyback 300    Total Intake(mL/kg) 3141.3 (45.7)    Urine (mL/kg/hr) 2225 (1.3)    Blood 100 (0.1)    Total Output 2325     Net +816.3             LABORATORY DATA:  Recent Labs  11/07/14 0450  WBC 12.7*  HGB 11.4*  HCT 36.9  PLT 233    Recent Labs  11/07/14 0450  NA 137  K 4.1  CL 107  CO2 24  BUN 6  CREATININE 0.65  GLUCOSE 114*  CALCIUM 8.4*   Lab Results  Component Value Date   INR 0.99 10/29/2014   INR  0.91 03/17/2013    Recent Radiographic Studies :  Dg Chest 2 View  10/29/2014   CLINICAL DATA:  Preop right knee surgery  EXAM: CHEST  2 VIEW  COMPARISON:  None.  FINDINGS: The heart size and mediastinal contours are within normal limits. Both lungs are clear. The visualized skeletal structures are unremarkable.  IMPRESSION: No active cardiopulmonary disease.   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

## 2014-11-07 NOTE — Progress Notes (Signed)
Occupational Therapy Evaluation Patient Details Name: [name] MRN: [id number] DOB: 02-Jan-1956 Today's Date: 11/07/2014    History of Present Illness Patient is a 59 y/o female s/p right total knee revision. PMH of bipolar disorder, migraine, depression, osteoporosis and hypothyroidism.   Clinical Impression   Pt admitted with the above diagnoses and presents with below problem list. Pt will benefit from continued acute OT to address the below listed deficits and maximize independence with BADLs prior to d/c home with family. PTA pt was independent with ADLs. Pt currently at min A level for LB ADLs. ADLs completed and education provided as detailed below. OT to continue to follow acutely.     Follow Up Recommendations  Supervision/Assistance - 24 hour;No OT follow up    Equipment Recommendations  None recommended by OT    Recommendations for Other Services       Precautions / Restrictions Precautions Precautions: Knee Precaution Booklet Issued: No Precaution Comments: reviewed knee precautions. pt unable to recall 50% PWB restriction Restrictions Weight Bearing Restrictions: Yes RLE Weight Bearing: Partial weight bearing RLE Partial Weight Bearing Percentage or Pounds: 50%      Mobility Bed Mobility Overal bed mobility: Needs Assistance Bed Mobility: Supine to Sit     Supine to sit: Min assist;HOB elevated     General bed mobility comments: Min A to advance RLE.   Transfers Overall transfer level: Needs assistance Equipment used: Rolling walker (2 wheeled) Transfers: Sit to/from [employer] CorporationStand;Stand Pivot Transfers Sit to Stand: Min assist Stand pivot transfers: Min assist       General transfer comment: sit<>stand from EOB, 3n1, and recliner. SPT from EOB to 3n1. Cues for technique with rw. Min A for balance, powerup, and control of descent. Cued for WB RLE.     Balance Overall balance assessment: Needs assistance Sitting-balance support: Feet supported;No  upper extremity supported Sitting balance-Leahy Scale: Fair     Standing balance support: Bilateral upper extremity supported;During functional activity Standing balance-Leahy Scale: Poor Standing balance comment: needs rw to maintain PWB status                            ADL Overall ADL's : Needs assistance/impaired Eating/Feeding: Set up;Sitting   Grooming: Set up;Sitting   Upper Body Bathing: Sitting;Min guard   Lower Body Bathing: Minimal assistance;Sit to/from stand;With adaptive equipment   Upper Body Dressing : Set up;Sitting   Lower Body Dressing: Minimal assistance;Sit to/from stand;With adaptive equipment   Toilet Transfer: Minimal assistance;Stand-pivot;BSC;RW   Toileting- Clothing Manipulation and Hygiene: Minimal assistance;Sit to/from stand   Tub/ Shower Transfer: Minimal assistance;Stand-pivot;3 in 1;Rolling walker   Functional mobility during ADLs: Minimal assistance;Rolling walker (ambulated 5 feet from [facility] to recliner) General ADL Comments: Pt completed bed mobility with min A to advance RLE. Pt at min A for LB ADLs, transfers, and functional mobility due to balance, light assist for powerup and to control speed of descent. Reviewed ADL education for compensatory strategies for knee precautions and WB restrictions. Discussed home setup as well.      Vision     Perception     Praxis      Pertinent Vitals/Pain Pain Assessment: 0-10 Pain Score: 2  Pain Location: rt knee  Pain Descriptors / Indicators: Sore Pain Intervention(s): Monitored during session;Repositioned     Hand Dominance     Extremity/Trunk Assessment Upper Extremity Assessment Upper Extremity Assessment: Overall WFL for tasks assessed   Lower Extremity Assessment Lower  Extremity Assessment: Defer to PT evaluation       Communication Communication Communication: No difficulties   Cognition Arousal/Alertness: Awake/alert Behavior During Therapy: WFL for tasks  assessed/performed Overall Cognitive Status: Within Functional Limits for tasks assessed                     General Comments       Exercises       Shoulder Instructions      Home Living Family/patient expects to be discharged to:: Private residence Living Arrangements: Spouse/significant other Available Help at Discharge: Family;Available 24 hours/day Type of Home: House Home Access: Stairs to enter Entergy CorporationEntrance Stairs-Number of Steps: 5 Entrance Stairs-Rails: Can reach both;Left;Right Home Layout: One level     Bathroom Shower/Tub: Tub/shower unit Shower/tub characteristics: Curtain FirefighterBathroom Toilet: Handicapped height     Home Equipment: Environmental consultantWalker - 2 wheels;Bedside commode;Shower seat          Prior Functioning/Environment Level of Independence: Independent        Comments: Works as a Engineer, sitemedical assistant.    OT Diagnosis: Acute pain   OT Problem List: Impaired balance (sitting and/or standing);Decreased knowledge of use of DME or AE;Decreased knowledge of precautions;Pain   OT Treatment/Interventions: Self-care/ADL training;DME and/or AE instruction;Therapeutic activities;Patient/family education;Balance training    OT Goals(Current goals can be found in the care plan section) Acute Rehab OT Goals Patient Stated Goal: not stated OT Goal Formulation: With patient Time For Goal Achievement: 11/14/14 Potential to Achieve Goals: Good ADL Goals Pt Will Perform Lower Body Bathing: with modified independence;with adaptive equipment;sit to/from stand Pt Will Perform Lower Body Dressing: with modified independence;with adaptive equipment;sit to/from stand Pt Will Transfer to Toilet: with modified independence;ambulating (3n1 over toilet) Pt Will Perform Tub/Shower Transfer: with modified independence;Tub transfer;shower seat;rolling walker;ambulating Additional ADL Goal #1: Pt will complete bed moblity at mod I level to prepare for OOB ADLs.  OT Frequency: Min  2X/week   Barriers to D/C:            Co-evaluation              End of Session Equipment Utilized During Treatment: Gait belt;Rolling walker CPM Right Knee CPM Right Knee: Off Additional Comments: yellow foam under rt heel Nurse Communication: Other (comment) (first time voiding post cath)  Activity Tolerance: Patient tolerated treatment well Patient left: in chair;with call bell/phone within reach;with chair alarm set   Time: 1610-96040901-0925 OT Time Calculation (min): 24 min Charges:  OT General Charges $OT Visit: 1 Procedure OT Evaluation $Initial OT Evaluation Tier I: 1 Procedure OT Treatments $Self Care/Home Management : 8-22 mins G-Codes:    Pilar GrammesMathews, Reni Hausner H 11/07/2014, 9:45 AM

## 2014-11-08 ENCOUNTER — Encounter (HOSPITAL_COMMUNITY): Payer: Self-pay | Admitting: Orthopaedic Surgery

## 2014-11-08 LAB — BASIC METABOLIC PANEL
Anion gap: 8 (ref 5–15)
BUN: 5 mg/dL — ABNORMAL LOW (ref 6–20)
CALCIUM: 8.5 mg/dL — AB (ref 8.9–10.3)
CO2: 23 mmol/L (ref 22–32)
CREATININE: 0.55 mg/dL (ref 0.44–1.00)
Chloride: 105 mmol/L (ref 101–111)
GFR calc Af Amer: >60 mL/min (ref >60)
GFR calc non Af Amer: >60 mL/min (ref >60)
Glucose, Bld: 114 mg/dL — ABNORMAL HIGH (ref 65–99)
Potassium: 3.4 mmol/L — ABNORMAL LOW (ref 3.5–5.1)
SODIUM: 136 mmol/L (ref 135–145)

## 2014-11-08 LAB — CBC WITH DIFFERENTIAL/PLATELET
BASOS ABS: 0 10*3/uL (ref 0.0–0.1)
BASOS PCT: 0 % (ref 0–1)
EOS ABS: 0 10*3/uL (ref 0.0–0.7)
Eosinophils Relative: 0 % (ref 0–5)
HCT: 35 % — ABNORMAL LOW (ref 36.0–46.0)
Hemoglobin: 11 g/dL — ABNORMAL LOW (ref 12.0–15.0)
Lymphocytes Relative: 9 % — ABNORMAL LOW (ref 12–46)
Lymphs Abs: 1.5 10*3/uL (ref 0.7–4.0)
MCH: 29 pg (ref 26.0–34.0)
MCHC: 31.4 g/dL (ref 30.0–36.0)
MCV: 92.3 fL (ref 78.0–100.0)
Monocytes Absolute: 0.7 10*3/uL (ref 0.1–1.0)
Monocytes Relative: 4 % (ref 3–12)
NEUTROS ABS: 14.3 10*3/uL — AB (ref 1.7–7.7)
NEUTROS PCT: 87 % — AB (ref 43–77)
PLATELETS: 189 10*3/uL (ref 150–400)
RBC: 3.79 MIL/uL — AB (ref 3.87–5.11)
RDW: 14.2 % (ref 11.5–15.5)
WBC: 16.5 10*3/uL — AB (ref 4.0–10.5)

## 2014-11-08 LAB — CBC
HEMATOCRIT: 34 % — AB (ref 36.0–46.0)
Hemoglobin: 11.1 g/dL — ABNORMAL LOW (ref 12.0–15.0)
MCH: 30.1 pg (ref 26.0–34.0)
MCHC: 32.6 g/dL (ref 30.0–36.0)
MCV: 92.1 fL (ref 78.0–100.0)
PLATELETS: 191 10*3/uL (ref 150–400)
RBC: 3.69 MIL/uL — ABNORMAL LOW (ref 3.87–5.11)
RDW: 14.2 % (ref 11.5–15.5)
WBC: 16.5 10*3/uL — AB (ref 4.0–10.5)

## 2014-11-08 MED ORDER — HYDROMORPHONE HCL 2 MG PO TABS: 2.0000 mg | ORAL_TABLET | ORAL | Status: AC | PRN

## 2014-11-08 MED ORDER — METHOCARBAMOL 500 MG PO TABS: 500.0000 mg | ORAL_TABLET | Freq: Four times a day (QID) | ORAL | Status: AC | PRN

## 2014-11-08 MED ORDER — RIVAROXABAN 10 MG PO TABS: 10.0000 mg | ORAL_TABLET | Freq: Every day | ORAL | Status: AC

## 2014-11-08 NOTE — Discharge Summary (Signed)
Norlene CampbellPeter Whitfield, MD   Jacqualine CodeBrian Petrarca, PA-C [address], [place], [place]  [address]                             [phone]  PATIENT ID: Kathryn StallsDebra L C Jones        MRN:  [id number]          DOB/AGE: 10/23/1955 / 59 y.o.    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 Bipolar disorder   . Migraine   . Depression   . GERD (gastroesophageal reflux disease)   . Arthritis   . Osteoporosis   . Hypothyroidism   . Pneumonia 1992  . Bruises easily     PROCEDURE: Procedure(s): TOTAL KNEE REVISION Right on 11/06/2014  CONSULTS: none     HISTORY: Kathryn KidneyDebra is a very pleasant 59 year old white female who had undergone a right total knee arthroplasty in October of 2014. 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 She had done well with her knee,however she started having some problems with the knee with swelling and discomfort and stiffness then back in March of this year. At that time she had an effusion and then an aspiration was performed on August 22, 2014, which revealed negative for any anaerobic or aerobic growth. C-reactive protein of 24 and a sed rate was 5. Her CBC was normal with a 9,900 white count. She is also a cigarette smoker and has been noted to have osteoporosis with a T-score of -2.6. She has continued to have symptoms and on the 16 she did have a corticosteroid injection to the knee, which was helpful for a short time, but then she started having some more pain. She is being treated with Forteo for her osteopenia and  osteoporosis. She continues to have pain and discomfort in the knee. A 3-phase bone scan was ordered, which showedabnormalasymmetricincrease uptake in all 3 phases localizing to the right knee arthroplasty compatible with either arthroplasty device loosening or infection. Therefore it was felt that she probably has had loosening of the prosthesis and is now being admitted at this time for a revision right total knee arthroplasty  HOSPITAL COURSE:  [name] is a 59 y.o. admitted on 11/06/2014 and found to have a diagnosis of LOOSENED RIGHT TOTAL KNEE ARHTROPLASTY.  After appropriate laboratory studies were obtained  they were taken to the operating room on 11/06/2014 and underwent  Procedure(s): TOTAL KNEE REVISION  Right.   They were given perioperative antibiotics:  Anti-infectives    Start     Dose/Rate Route Frequency Ordered Stop   11/06/14 1300  ceFAZolin (ANCEF) IVPB 2 g/50 mL premix     2 g 100 mL/hr over 30 Minutes Intravenous Every 6 hours 11/06/14 1248 11/07/14 0059   11/06/14 0802  vancomycin (VANCOCIN) powder  Status:  Discontinued       As needed 11/06/14 0802 11/06/14 1048   11/06/14 0630  ceFAZolin (ANCEF) IVPB 2 g/50 mL premix  Status:  Discontinued  2 g 100 mL/hr over 30 Minutes Intravenous To Surgery 11/05/14 1348 11/05/14 1356   11/06/14 0553  ceFAZolin (ANCEF) 2-3 GM-% IVPB SOLR    Comments:  Rogelia MireMichael, Cynthia   : cabinet override      11/06/14 0553 11/06/14 0730    .  Tolerated the procedure well.  Placed with a foley intraoperatively.      POD #1, allowed out of bed to a chair.  PT for ambulation and exercise program.  Foley D/C'd in morning.  IV saline locked.  O2 discontionued.  POD #2, continued PT and ambulation.  Dressing changed. Patient desires discharge  The remainder of the hospital course was dedicated to ambulation and strengthening.   The patient was discharged on 2 Days Post-Op in  Stable condition.  Blood products given:none  DIAGNOSTIC  STUDIES: Recent vital signs: Patient Vitals for the past 24 hrs:  BP Temp Temp src Pulse Resp SpO2 Height Weight  11/08/14 0454 (!) 133/59 mmHg 98.6 F (37 C) Oral 96 18 100 % - -  11/07/14 2100 (!) 128/58 mmHg 98.4 F (36.9 C) Oral 83 15 95 % 5\' 6"  (1.676 m) 68.8 kg (151 lb 10.8 oz)       Recent laboratory studies:  Recent Labs  11/07/14 0450 11/08/14 0556  WBC 12.7* 16.5*  HGB 11.4* 11.1*  HCT 36.9 34.0*  PLT 233 191    Recent Labs  11/07/14 0450 11/08/14 0556  NA 137 136  K 4.1 3.4*  CL 107 105  CO2 24 23  BUN 6 5*  CREATININE 0.65 0.55  GLUCOSE 114* 114*  CALCIUM 8.4* 8.5*   Lab Results  Component Value Date   INR 0.99 10/29/2014   INR 0.91 03/17/2013     Recent Radiographic Studies :  Dg Chest 2 View  10/29/2014   CLINICAL DATA:  Preop right knee surgery  EXAM: CHEST  2 VIEW  COMPARISON:  None.  FINDINGS: The heart size and mediastinal contours are within normal limits. Both lungs are clear. The visualized skeletal structures are unremarkable.  IMPRESSION: No active cardiopulmonary disease.   Electronically Signed   By: Elige KoHetal  Patel   On: 10/29/2014 15:54    DISCHARGE INSTRUCTIONS: Discharge Instructions    CPM    Complete by:  As directed   Continuous passive motion machine (CPM):      Use the CPM from 0 to 60 for 6-8 hours per day.      You may increase by 5-10 degrees per day.  You may break it up into 2 or 3 sessions per day.      Use CPM for 3-4  weeks or until you are told to stop.     Call MD / Call 911    Complete by:  As directed   If you experience chest pain or shortness of breath, CALL 911 and be transported to the hospital emergency room.  If you develope a fever above 101 F, pus (white drainage) or increased drainage or redness at the wound, or calf pain, call your surgeon's office.     Change dressing    Complete by:  As directed   DO NOT CHANGE YOUR DRESSING     Constipation Prevention    Complete by:  As directed   Drink plenty of  fluids.  Prune juice may be helpful.  You may use a stool softener, such as Colace (over the counter) 100 mg twice a day.  Use MiraLax (over the counter) for constipation  as needed.     Diet general    Complete by:  As directed      Discharge instructions    Complete by:  As directed   INSTRUCTIONS AFTER JOINT REPLACEMENT   Remove items at home which could result in a fall. This includes throw rugs or furniture in walking pathways ICE to the affected joint every three hours while awake for 30 minutes at a time, for at least the first 3-5 days, and then as needed for pain and swelling.  Continue to use ice for pain and swelling. You may notice swelling that will progress down to the foot and ankle.  This is normal after surgery.  Elevate your leg when you are not up walking on it.   Continue to use the breathing machine you got in the hospital (incentive spirometer) which will help keep your temperature down.  It is common for your temperature to cycle up and down following surgery, especially at night when you are not up moving around and exerting yourself.  The breathing machine keeps your lungs expanded and your temperature down.   DIET:  As you were doing prior to hospitalization, we recommend a well-balanced diet.  DRESSING / WOUND CARE / SHOWERING  Keep the surgical dressing until follow up.  The dressing is water proof, so you can shower without any extra covering.  IF THE DRESSING FALLS OFF or the wound gets wet inside, change the dressing with sterile gauze.  Please use good hand washing techniques before changing the dressing.  Do not use any lotions or creams on the incision until instructed by your surgeon.    ACTIVITY  Increase activity slowly as tolerated, but follow the weight bearing instructions below.   No driving for 6 weeks or until further direction given by your physician.  You cannot drive while taking narcotics.  No lifting or carrying greater than 10 lbs. until further  directed by your surgeon. Avoid periods of inactivity such as sitting longer than an hour when not asleep. This helps prevent blood clots.  You may return to work once you are authorized by your doctor.     WEIGHT BEARING   Partial weight bearing with assist device as directed.  50%   EXERCISES  Results after joint replacement surgery are often greatly improved when you follow the exercise, range of motion and muscle strengthening exercises prescribed by your doctor. Safety measures are also important to protect the joint from further injury. Any time any of these exercises cause you to have increased pain or swelling, decrease what you are doing until you are comfortable again and then slowly increase them. If you have problems or questions, call your caregiver or physical therapist for advice.   Rehabilitation is important following a joint replacement. After just a few days of immobilization, the muscles of the leg can become weakened and shrink (atrophy).  These exercises are designed to build up the tone and strength of the thigh and leg muscles and to improve motion. Often times heat used for twenty to thirty minutes before working out will loosen up your tissues and help with improving the range of motion but do not use heat for the first two weeks following surgery (sometimes heat can increase post-operative swelling).   These exercises can be done on  a table or on a bed. Use whatever works the best and is most comfortable for you.    Use music or television while you are exercising so that  the exercises are a pleasant break in your day. This will make your life better with the exercises acting as a break in your routine that you can look forward to.   Perform all exercises about fifteen times, three times per day or as directed.  You should exercise both the operative leg and the other leg as well.   Exercises include:   Quad Sets - Tighten up the muscle on the front of the thigh  (Quad) and hold for 5-10 seconds.   Straight Leg Raises - With your knee straight (if you were given a brace, keep it on), lift the leg to 60 degrees, hold for 3 seconds, and slowly lower the leg.  Perform this exercise against resistance later as your leg gets stronger.  Leg Slides: Lying on your back, slowly slide your foot toward your buttocks, bending your knee up off the floor (only go as far as is comfortable). Then slowly slide your foot back down until your leg is flat on the floor again.  Angel Wings: Lying on your back spread your legs to the side as far apart as you can without causing discomfort.  Hamstring Strength:  Lying on your back, push your heel against the floor with your leg straight by tightening up the muscles of your buttocks.  Repeat, but this time bend your knee to a comfortable angle, and push your heel against the floor.  You may put a pillow under the heel to make it more comfortable if necessary.   A rehabilitation program following joint replacement surgery can speed recovery and prevent re-injury in the future due to weakened muscles. Contact your doctor or a physical therapist for more information on knee rehabilitation.    CONSTIPATION  Constipation is defined medically as fewer than three stools per week and severe constipation as less than one stool per week.  Even if you have a regular bowel pattern at home, your normal regimen is likely to be disrupted due to multiple reasons following surgery.  Combination of anesthesia, postoperative narcotics, change in appetite and fluid intake all can affect your bowels.   YOU MUST use at least one of the following options; they are listed in order of increasing strength to get the job done.  They are all available over the counter, and you may need to use some, POSSIBLY even all of these options:    Drink plenty of fluids (prune juice may be helpful) and high fiber foods Colace 100 mg by mouth twice a day  Senokot for  constipation as directed and as needed Dulcolax (bisacodyl), take with full glass of water  Miralax (polyethylene glycol) once or twice a day as needed.  If you have tried all these things and are unable to have a bowel movement in the first 3-4 days after surgery call either your surgeon or your primary doctor.    If you experience loose stools or diarrhea, hold the medications until you stool forms back up.  If your symptoms do not get better within 1 week or if they get worse, check with your doctor.  If you experience "the worst abdominal pain ever" or develop nausea or vomiting, please contact the office immediately for further recommendations for treatment.   ITCHING:  If you experience itching with your medications, try taking only a single pain pill, or even half a pain pill at a time.  You can also use Benadryl over the counter for itching or also to help with sleep.  TED HOSE STOCKINGS:  Use stockings on both legs until for at least 2 weeks or as directed by physician office. They may be removed at night for sleeping.  MEDICATIONS:  See your medication summary on the "After Visit Summary" that nursing will review with you.  You may have some home medications which will be placed on hold until you complete the course of blood thinner medication.  It is important for you to complete the blood thinner medication as prescribed.  PRECAUTIONS:  If you experience chest pain or shortness of breath - call 911 immediately for transfer to the hospital emergency department.   If you develop a fever greater that 101 F, purulent drainage from wound, increased redness or drainage from wound, foul odor from the wound/dressing, or calf pain - CONTACT YOUR SURGEON.                                                   FOLLOW-UP APPOINTMENTS:  If you do not already have a post-op appointment, please call the office for an appointment to be seen by your surgeon.  Guidelines for how soon to be seen are listed in  your "After Visit Summary", but are typically between 1-4 weeks after surgery.  OTHER INSTRUCTIONS:   Knee Replacement:  Do not place pillow under knee, focus on keeping the knee straight while resting. CPM instructions: 0-90 degrees, 2 hours in the morning, 2 hours in the afternoon, and 2 hours in the evening. Place foam block, curve side up under heel at all times except when in CPM or when walking.  DO NOT modify, tear, cut, or change the foam block in any way.  MAKE SURE YOU:  Understand these instructions.  Get help right away if you are not doing well or get worse.    Thank you for letting us be a part of your medical care team.  It is a privilege we respect greatly.  We hope these instructions will help you stay on track for a fast and full recovery!     Do not put a pillow under the knee. Place it under the heel.    Complete by:  As directed      Driving restrictions    Complete by:  As directed   No driving for 6 weeks     Increase activity slowly as tolerated    Complete by:  As directed      Lifting restrictions    Complete by:  As directed   No lifting for 6 weeks     Partial weight bearing    Complete by:  As directed   % Body Weight:  50%  Laterality:  right  Extremity:  Lower     Patient may shower    Complete by:  As directed   You may shower over the brown dressing     TED hose    Complete by:  As directed   Use stockings (TED hose) for 2-3 weeks on right leg.  You may remove them at night for sleeping.           DISCHARGE MEDICATIONS:     Medication List    STOP taking these medications        ibuprofen 200 MG tablet  Commonly known as:  ADVIL,MOTRIN      TAKE these medications  acetaminophen 500 MG tablet  Commonly known as:  TYLENOL  Take 500 mg by mouth every 6 (six) hours as needed for mild pain.     dexlansoprazole 60 MG capsule  Commonly known as:  DEXILANT  Take 60 mg by mouth daily.     DULoxetine 60 MG capsule  Commonly known  as:  CYMBALTA  Take 60 mg by mouth daily.     HYDROmorphone 2 MG tablet  Commonly known as:  DILAUDID  Take 1 tablet (2 mg total) by mouth every 4 (four) hours as needed for severe pain (1 - 2 TABLETS Q 4H PRN PAIN).     levothyroxine 50 MCG tablet  Commonly known as:  SYNTHROID, LEVOTHROID  Take 50 mcg by mouth daily before breakfast.     lithium carbonate 300 MG capsule  Take 300 mg by mouth 2 (two) times daily with a meal.     methocarbamol 500 MG tablet  Commonly known as:  ROBAXIN  Take 1 tablet (500 mg total) by mouth every 6 (six) hours as needed for muscle spasms.     primidone 50 MG tablet  Commonly known as:  MYSOLINE  Take 200 mg by mouth at bedtime.     rivaroxaban 10 MG Tabs tablet  Commonly known as:  XARELTO  Take 1 tablet (10 mg total) by mouth daily with breakfast.     Teriparatide (Recombinant) 600 MCG/2.4ML Soln  Inject 20 mcg into the skin daily.        FOLLOW UP VISIT:       Follow-up Information    Follow up with [facility].   [facility]  [facility] and time for [facility].   Contact information:   [address] [place] [address] [phone]       Follow up with Valeria Batman, MD. Schedule an appointment as soon as possible for a visit on 11/21/2014.   Specialty:  Orthopedic Surgery   Contact information:   [address] [place] [address] [phone]       DISPOSITION:   Home  CONDITION:  Stable   Oris Drone. Aleda Grana [facility] [phone]  11/08/2014 1:29 PM

## 2014-11-08 NOTE — Progress Notes (Signed)
Rept to Jacqualine CodeBrian Petrarca PA regarding rechecked CBC. Pt instructed [place] go to pharmacy in [place] to [place] an antibiodic for possible UTI. Pt instructed to complete all of prescription as per MD order. D/C instructions done. Pt verb understanding and agrees to comply.

## 2014-11-08 NOTE — Progress Notes (Signed)
Patient ID: Kathryn StallsDebra L C Jones, female   DOB: 1955/06/25, 59 y.o.   MRN: [id number] PATIENT ID: [name]        MRN:  [id number]          DOB/AGE: 1955/06/25 / 59 y.o.    Norlene CampbellPeter Whitfield, MD   Jacqualine CodeBrian Petrarca, PA-C [address] [place], [place]  [address]                             [phone]   PROGRESS NOTE  Subjective:  negative for Chest Pain  negative for Shortness of Breath  negative for Nausea/Vomiting   negative for Calf Pain    Tolerating Diet: yes         Patient reports pain as mild and moderate.       Objective: Vital signs in last 24 hours:   Patient Vitals for the past 24 hrs:  BP Temp Temp src Pulse Resp SpO2 Height Weight  11/08/14 0454 (!) 133/59 mmHg 98.6 F (37 C) Oral 96 18 100 % - -  11/07/14 2100 (!) 128/58 mmHg 98.4 F (36.9 C) Oral 83 15 95 % 5\' 6"  (1.676 m) 68.8 kg (151 lb 10.8 oz)      Intake/Output from previous day:   05/18 0701 - 05/19 0700 In: 240 [P.O.:240] Out: -    Intake/Output this shift:       Intake/Output      05/18 0701 - 05/19 0700 05/19 0701 - 05/20 0700   P.O. 240    I.V. (mL/kg)     IV Piggyback     Total Intake(mL/kg) 240 (3.5)    Urine (mL/kg/hr)     Blood     Total Output       Net +240          Urine Occurrence 5 x 2 x      LABORATORY DATA:  Recent Labs  11/07/14 0450 11/08/14 0556  WBC 12.7* 16.5*  HGB 11.4* 11.1*  HCT 36.9 34.0*  PLT 233 191    Recent Labs  11/07/14 0450 11/08/14 0556  NA 137 136  K 4.1 3.4*  CL 107 105  CO2 24 23  BUN 6 5*  CREATININE 0.65 0.55  GLUCOSE 114* 114*  CALCIUM 8.4* 8.5*   Lab Results  Component Value Date   INR 0.99 10/29/2014   INR 0.91 03/17/2013    Recent Radiographic Studies :  Dg Chest 2 View  10/29/2014   CLINICAL DATA:  Preop right knee surgery  EXAM: CHEST  2 VIEW  COMPARISON:  None.  FINDINGS: The heart size and mediastinal contours are within normal limits. Both lungs are clear. The visualized skeletal structures are unremarkable.   IMPRESSION: No active cardiopulmonary disease.   Electronically Signed   By: Elige KoHetal  Patel   On: 10/29/2014 15:54     Examination:  General appearance: alert, cooperative, mild distress and moderate distress Resp: clear to auscultation bilaterally Cardio: regular rate and rhythm GI: normal findings: bowel sounds normal  Wound Exam: clean, dry, intact   Drainage:  None: wound tissue dry  Motor Exam: EHL, FHL, Anterior Tibial and Posterior Tibial Intact  Sensory Exam: Superficial Peroneal, Deep Peroneal and Tibial normal  Vascular Exam: Right dorsalis pedis artery has 1+ (weak) pulse  Assessment:    2 Days Post-Op  Procedure(s) (LRB): TOTAL KNEE REVISION (Right)  ADDITIONAL DIAGNOSIS:  Principal Problem:   Mechanical loosening of internal  right knee prosthetic joint Active Problems:   Loose total knee arthroplasty   Primary osteoarthritis of knee   Plan: Physical Therapy as ordered Partial Weight Bearing @ 50% (PWB)  DVT Prophylaxis:  Xarelto, Foot Pumps and TED hose  DISCHARGE PLAN: Home today  DISCHARGE NEEDS: HHPT, CPM, Walker and 3-in-1 comode seat        PETRARCA,BRIAN  11/08/2014 1:00 PM

## 2014-11-08 NOTE — Progress Notes (Signed)
Physical Therapy Treatment Patient Details Name: Kathryn StallsDebra L C Konkel MRN: [id number] DOB: 29-Jul-1955 Today's Date: 11/08/2014    History of Present Illness Patient is a 59 y/o female s/p right total knee revision. PMH of bipolar disorder, migraine, depression, osteoporosis and hypothyroidism.    PT Comments    Patient progressing well with mobility. Tolerated stair negotiation with Min A for balance/safety. Knee flexion AROM improving today. More alert today during session. Encouraged yellow foam under right ankle to enforce knee extension. Will follow up in PM to perform stair training in stairwell as pt needs to negotiate 5 steps to enter home.   Follow Up Recommendations  Home health PT;Supervision/Assistance - 24 hour     Equipment Recommendations  None recommended by PT    Recommendations for Other Services       Precautions / Restrictions Precautions Precautions: Knee Precaution Booklet Issued: Yes (comment) Precaution Comments: Reviewed no pillow under knee and precautions. Restrictions Weight Bearing Restrictions: Yes RLE Weight Bearing: Partial weight bearing RLE Partial Weight Bearing Percentage or Pounds: 50%    Mobility  Bed Mobility               General bed mobility comments: Sitting in recliner upon PT arrival.   Transfers Overall transfer level: Needs assistance Equipment used: Rolling walker (2 wheeled) Transfers: Sit to/from Stand Sit to Stand: Min guard         General transfer comment: Min guard to rise from chair x1.  Ambulation/Gait Ambulation/Gait assistance: Min guard Ambulation Distance (Feet): 75 Feet Assistive device: Rolling walker (2 wheeled) Gait Pattern/deviations: Step-to pattern;Decreased stance time - right;Decreased step length - left;Trunk flexed   Gait velocity interpretation: Below normal speed for age/gender General Gait Details: Cues to adhere to PWB RLE. Standing rest breaks due to fatigue. INcreased tremoring note  throughout body; "this started happening when I woke up from surgery."   Stairs Stairs: Yes Stairs assistance: Min assist Stair Management: Two rails;Step to pattern;Forwards Number of Stairs: 2 General stair comments: Min A to ascend steps. Use of BUEs on rails for support. Cues for technique.  Wheelchair Mobility    Modified Rankin (Stroke Patients Only)       Balance Overall balance assessment: Needs assistance Sitting-balance support: Feet supported;No upper extremity supported Sitting balance-Leahy Scale: Good     Standing balance support: During functional activity Standing balance-Leahy Scale: Poor                      Cognition Arousal/Alertness: Awake/alert Behavior During Therapy: WFL for tasks assessed/performed Overall Cognitive Status: Within Functional Limits for tasks assessed                      Exercises Total Joint Exercises Ankle Circles/Pumps: Both;15 reps;Seated Quad Sets: Right;5 reps;Seated Knee Flexion: Right;5 reps;Seated Goniometric ROM: -15-70 degrees knee AROM.    General Comments        Pertinent Vitals/Pain Pain Assessment: 0-10 Pain Score: 6  Pain Location: right knee Pain Descriptors / Indicators: Sore;Aching Pain Intervention(s): Limited activity within patient's tolerance;Monitored during session;Repositioned;Ice applied    Home Living                      Prior Function            PT Goals (current goals can now be found in the care plan section) Progress towards PT goals: Progressing toward goals    Frequency  7X/week    PT  Plan Current plan remains appropriate    Co-evaluation             End of Session Equipment Utilized During Treatment: Gait belt Activity Tolerance: Patient tolerated treatment well Patient left: in chair;with call bell/phone within reach;with chair alarm set;with family/visitor present     Time: 6962-95280948-1007 PT Time Calculation (min) (ACUTE ONLY): 19  min  Charges:  $Gait Training: 8-22 mins                    G Codes:      Keziah Avis A Lileigh Fahringer 11/08/2014, 10:26 AM  Mylo RedShauna Karee Forge, PT, DPT [phone]

## 2014-11-08 NOTE — Progress Notes (Signed)
Physical Therapy Treatment Patient Details Name: Kathryn StallsDebra L C Ury MRN: [id number] DOB: 07-21-1955 Today's Date: 11/08/2014    History of Present Illness Patient is a 59 y/o female s/p right total knee revision. PMH of bipolar disorder, migraine, depression, osteoporosis and hypothyroidism.    PT Comments    Patient progressing well towards PT goals. Performed family training for stair negotiation with spouse assisting. Reviewed HEP. Emphasized importance of PWB RLE during all mobility. Will continue to follow if pt still in hospital to maximize independence.   Follow Up Recommendations  Home health PT;Supervision/Assistance - 24 hour     Equipment Recommendations  None recommended by PT    Recommendations for Other Services       Precautions / Restrictions Precautions Precautions: Knee Precaution Booklet Issued: Yes (comment) Precaution Comments: Reviewed no pillow under knee and precautions. Restrictions Weight Bearing Restrictions: Yes RLE Weight Bearing: Partial weight bearing RLE Partial Weight Bearing Percentage or Pounds: 50%    Mobility  Bed Mobility Overal bed mobility: Needs Assistance Bed Mobility: Supine to Sit     Supine to sit: Min guard;HOB elevated     General bed mobility comments: Min guard for safety. INcreased time to get to EOB due to difficulty getting RLE off bed. Cues for technique.  Transfers Overall transfer level: Needs assistance Equipment used: Rolling walker (2 wheeled) Transfers: Sit to/from Stand Sit to Stand: Min guard         General transfer comment: Min guard to rise from EOB x1, from chair x2. [facility].  Ambulation/Gait Ambulation/Gait assistance: Min guard Ambulation Distance (Feet): 10 Feet Assistive device: Rolling walker (2 wheeled) Gait Pattern/deviations: Step-to pattern;Decreased stance time - right;Decreased step length - left;Trunk flexed   Gait velocity interpretation: Below normal speed for  age/gender General Gait Details: Cues to adhere to PWB RLE. Cues for upright posture and RW management. Tends to push RW away too early - cues to keep it until getting to desired destination.   Stairs Stairs: Yes Stairs assistance: Min assist Stair Management: With walker;Backwards Number of Stairs: 5 General stair comments: Cues for technique. Husband present to stabilize RW to get up/down steps. Family training performed. Compliant with PWB.  Wheelchair Mobility    Modified Rankin (Stroke Patients Only)       Balance Overall balance assessment: Needs assistance Sitting-balance support: Feet supported;No upper extremity supported Sitting balance-Leahy Scale: Good     Standing balance support: During functional activity Standing balance-Leahy Scale: Poor                      Cognition Arousal/Alertness: Awake/alert Behavior During Therapy: WFL for tasks assessed/performed Overall Cognitive Status: Within Functional Limits for tasks assessed                      Exercises      General Comments        Pertinent Vitals/Pain Pain Assessment: Faces Faces Pain Scale: Hurts even more Pain Location: right knee with movement. Pain Descriptors / Indicators: Sore;Aching Pain Intervention(s): Limited activity within patient's tolerance;Ice applied;Monitored during session;Repositioned    Home Living                      Prior Function            PT Goals (current goals can now be found in the care plan section) Progress towards PT goals: Progressing toward goals    Frequency  7X/week    PT Plan Current plan remains appropriate    Co-evaluation             End of Session Equipment Utilized During Treatment: Gait belt Activity Tolerance: Patient tolerated treatment well Patient left: in chair;with call bell/phone within reach;with family/visitor present     Time: [id number] PT Time Calculation (min) (ACUTE ONLY): 24  min  Charges:  $Gait Training: 8-22 mins $Self Care/Home Management: 8-22                    G Codes:      Lynnelle Mesmer A Brevyn Ring 11/08/2014, 3:09 PM  Mylo RedShauna Szymon Foiles, PT, DPT [phone]

## 2014-11-09 LAB — BODY FLUID CULTURE: Culture: NO GROWTH

## 2014-11-11 LAB — ANAEROBIC CULTURE

## 2014-11-29 ENCOUNTER — Ambulatory Visit: Payer: PRIVATE HEALTH INSURANCE | Attending: Orthopaedic Surgery | Admitting: Physical Therapy

## 2014-11-29 DIAGNOSIS — M25661 Stiffness of right knee, not elsewhere classified: Secondary | ICD-10-CM | POA: Insufficient documentation

## 2014-11-29 DIAGNOSIS — M25561 Pain in right knee: Secondary | ICD-10-CM | POA: Diagnosis not present

## 2014-11-29 DIAGNOSIS — R269 Unspecified abnormalities of gait and mobility: Secondary | ICD-10-CM | POA: Diagnosis not present

## 2014-11-29 NOTE — Therapy (Signed)
[facility] [address] [place], [place], [address] Phone: [phone]   Fax:  [phone]  Physical Therapy Evaluation  Patient Details  Name: [name] MRN: [id number] Date of Birth: 02-08-1956 Referring Provider:  Valeria Batman, MD  Encounter Date: 11/29/2014      PT End of Session - 11/29/14 1513    Visit Number 1   Number of Visits 12   Date for PT Re-Evaluation 01/24/15   PT Start Time 0234   PT Stop Time 0335   PT Time Calculation (min) 61 min   Activity Tolerance Patient tolerated treatment well   Behavior During Therapy [facility] for tasks assessed/performed      Past Medical History  Diagnosis Date  . Bipolar disorder   . Migraine   . Depression   . GERD (gastroesophageal reflux disease)   . Arthritis   . Osteoporosis   . Hypothyroidism   . Pneumonia 1992  . Bruises easily     Past Surgical History  Procedure Laterality Date  . Appendectomy  1974  . Knee arthroscopy Right 1982  . Carpal tunnel release Right 1985  . Orif ankle fracture Left   . Dilation and curettage of uterus      x 3  . Colonoscopy    . Total knee arthroplasty Right 03/28/2013    Dr Cleophas Dunker  . Total knee arthroplasty Right 03/28/2013    Procedure: TOTAL KNEE ARTHROPLASTY;  Surgeon: Valeria Batman, MD;  Location: [facility] OR;  Service: Orthopedics;  Laterality: Right;  . Total knee revision Right 11/06/2014    Procedure: TOTAL KNEE REVISION;  Surgeon: Valeria Batman, MD;  Location: [facility] OR;  Service: Orthopedics;  Laterality: Right;    There were no vitals filed for this visit.  Visit Diagnosis:  Right knee pain - Plan: PT plan of care cert/re-cert  Knee stiffness, right - Plan: PT plan of care cert/re-cert  Abnormality of gait - Plan: PT plan of care cert/re-cert      Subjective Assessment - 11/29/14 1505    Subjective Had to be with my sister who was in the hospital follwoing my surgery and did very little therapy.   Limitations Sitting;Standing;Walking   How long can you sit comfortably? 20 minutes.   How long can you stand comfortably? 20 minutes.   How long can you walk comfortably? 20 minutes.   Patient Stated Goals get out of pain and walk good again without assistance.   Pain Score 4    Pain Location Knee   Pain Orientation Right   Pain Descriptors / Indicators Aching   Pain Type Surgical pain   Pain Frequency Constant            [facility] PT Assessment - 11/29/14 0001    Assessment   Medical Diagnosis S/p revision right total kne replacement.   Onset Date/Surgical Date --  11/06/14.   Next MD Visit --  12/05/14.   Precautions   Precautions --  No ultrasound.   Restrictions   Weight Bearing Restrictions No   Balance Screen   Has the patient fallen in the past 6 months No   Has the patient had a decrease in activity level because of a fear of falling?  No   Is the patient reluctant to leave their home because of a fear of falling?  No   Home Tourist information centre manager residence   Prior Function   Level of Independence Independent   Observation/Other  Assessments-Edema    Edema Circumferential   Circumferential Edema   Circumferential - Right 2 cms > left.   ROM / Strength   AROM / PROM / Strength AROM;PROM;Strength   AROM   Overall AROM Comments -15 degrees to 75 degrees.   PROM   Overall PROM Comments -12 degrees to 80 degrees.   Strength   Overall Strength Comments Right hip= 4/5 and right knee= 4-/5.   Palpation   Palpation comment Tender to palpation diffusely around right anterior knee.   Ambulation/Gait   Gait Comments Patient walking with right knee held in flexion using a FWW.                   [facility] Adult PT Treatment/Exercise - 11/29/14 0001    Exercises   Exercises Knee/Hip   Knee/Hip Exercises: Supine   Short Arc Quad Sets Limitations 15 minutes facilitated with VMS to right quadriceps (10 second on and 10 second off).   Modalities    Modalities Cryotherapy;Electrical Stimulation   Cryotherapy   Number Minutes Cryotherapy 15 Minutes   Cryotherapy Location --  Right knee.   Type of Cryotherapy --  Medium vasopneumatic.   Programme researcher, broadcasting/film/video Location --  Right knee.   Electrical Stimulation Action --  1-10 HZ   Electrical Stimulation Goals Edema;Pain                     PT Long Term Goals - 11/29/14 1522    PT LONG TERM GOAL #1   Title Ind with HEP.   Time 6   Period Weeks   Status New   PT LONG TERM GOAL #2   Title Achieve full acive right knee extension to normalize gait.   Time 6   Period Weeks   Status New   PT LONG TERM GOAL #3   Title Active right knee flexion to 115 degrees to increase function.   Time 6   Period Weeks   Status New   PT LONG TERM GOAL #4   Title 5/5 right knee strength to increase stability.   Time 6   Period Weeks   Status New   PT LONG TERM GOAL #5   Title Perform a reciprocating stair gait with one railing.   Time 6   Period Weeks   Status New   Additional Long Term Goals   Additional Long Term Goals Yes   PT LONG TERM GOAL #6   Title Walk a community distance without assistive device with pain not > 3/10.   Time 6   Period Weeks   Status New               Plan - 11/29/14 1520    Clinical Impression Statement The patient underwent a right total knee revision on 11/06/14.  She has had some home health physical therapy and rate her pain at a 4/10.  She is currently ambulating with a FWW.   Pt will benefit from skilled therapeutic intervention in order to improve on the following deficits Pain;Decreased activity tolerance;Decreased range of motion;Decreased strength   Rehab Potential Good   PT Frequency 3x / week   PT Duration 6 weeks   PT Treatment/Interventions ADLs/Self Care Home Management;Cryotherapy;Electrical Stimulation;Gait training;Stair training;Functional mobility training;Therapeutic exercise;Therapeutic  activities;Neuromuscular re-education;Patient/family education;Manual techniques;Vasopneumatic Device   PT Next Visit Plan THR protocol.         Problem List Patient Active Problem List   Diagnosis Date Noted  .  Loose total knee arthroplasty 11/06/2014  . 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 Primary osteoarthritis of knee 11/06/2014  . Osteoarthritis of right knee 03/30/2013    Trevor Wilkie, [place] MPT 11/29/2014, 3:42 PM  [facility] [address] [place], [place], [address] Phone: [phone]   Fax:  [phone]

## 2014-12-03 ENCOUNTER — Ambulatory Visit: Payer: PRIVATE HEALTH INSURANCE | Admitting: Physical Therapy

## 2014-12-03 DIAGNOSIS — M25561 Pain in right knee: Secondary | ICD-10-CM | POA: Diagnosis not present

## 2014-12-03 DIAGNOSIS — M25661 Stiffness of right knee, not elsewhere classified: Secondary | ICD-10-CM

## 2014-12-03 DIAGNOSIS — R269 Unspecified abnormalities of gait and mobility: Secondary | ICD-10-CM

## 2014-12-03 NOTE — Therapy (Signed)
[facility] [address] [place], [place], [address] Phone: [phone]   Fax:  [phone]  Physical Therapy Treatment  Patient Details  Name: [name] MRN: [id number] Date of Birth: Oct 21, 1955 Referring Provider:  Samuel Jester, DO  Encounter Date: 12/03/2014      PT End of Session - 12/03/14 1532    Visit Number 2   Number of Visits 12   Date for PT Re-Evaluation 01/24/15   PT Start Time 0234   PT Stop Time 0331   PT Time Calculation (min) 57 min   Activity Tolerance Patient tolerated treatment well   Behavior During Therapy [facility] for tasks assessed/performed      Past Medical History  Diagnosis Date  . Bipolar disorder   . Migraine   . Depression   . GERD (gastroesophageal reflux disease)   . Arthritis   . Osteoporosis   . Hypothyroidism   . Pneumonia 1992  . Bruises easily     Past Surgical History  Procedure Laterality Date  . Appendectomy  1974  . Knee arthroscopy Right 1982  . Carpal tunnel release Right 1985  . Orif ankle fracture Left   . Dilation and curettage of uterus      x 3  . Colonoscopy    . Total knee arthroplasty Right 03/28/2013    Dr Cleophas Dunker  . Total knee arthroplasty Right 03/28/2013    Procedure: TOTAL KNEE ARTHROPLASTY;  Surgeon: Valeria Batman, MD;  Location: [facility] OR;  Service: Orthopedics;  Laterality: Right;  . Total knee revision Right 11/06/2014    Procedure: TOTAL KNEE REVISION;  Surgeon: Valeria Batman, MD;  Location: [facility] OR;  Service: Orthopedics;  Laterality: Right;    There were no vitals filed for this visit.  Visit Diagnosis:  Right knee pain  Knee stiffness, right  Abnormality of gait      Subjective Assessment - 12/03/14 1444    Subjective No new complaints.   Limitations Sitting;Standing;Walking   How long can you sit comfortably? 20 minutes.   How long can you stand comfortably? 20 minutes.   How long can you walk comfortably? 20 minutes.   Patient  Stated Goals get out of pain and walk good again without assistance.   Pain Score 4    Pain Location Knee   Pain Orientation Right   Pain Descriptors / Indicators Aching   Pain Type Surgical pain   Pain Frequency Constant                                      PT Long Term Goals - 11/29/14 1522    PT LONG TERM GOAL #1   Title Ind with HEP.   Time 6   Period Weeks   Status New   PT LONG TERM GOAL #2   Title Achieve full acive right knee extension to normalize gait.   Time 6   Period Weeks   Status New   PT LONG TERM GOAL #3   Title Active right knee flexion to 115 degrees to increase function.   Time 6   Period Weeks   Status New   PT LONG TERM GOAL #4   Title 5/5 right knee strength to increase stability.   Time 6   Period Weeks   Status New   PT LONG TERM GOAL #5   Title Perform a reciprocating stair gait with one  railing.   Time 6   Period Weeks   Status New   Additional Long Term Goals   Additional Long Term Goals Yes   PT LONG TERM GOAL #6   Title Walk a community distance without assistive device with pain not > 3/10.   Time 6   Period Weeks   Status New     Treatment:  Nustep x 15 minutes at level 3 moving forward x 2 to increase flexion.  VMS to right quadriceps with patient performing SAQ's (non-resisted) x 15 minutes with 10 second extension holds and 10 second rest.  Elevation with medium vasopneumatic and pre-mod e' stim x 15 minutes at 1-10HZ .          Problem List Patient Active Problem List   Diagnosis Date Noted  . Loose total knee arthroplasty 11/06/2014  . 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 Primary osteoarthritis of knee 11/06/2014  . Osteoarthritis of right knee 03/30/2013    Ceri Mayer, [place] MPT 12/03/2014, 3:35 PM  [facility] [address] [place], [place], [address] Phone: [phone]   Fax:  [phone]

## 2014-12-04 ENCOUNTER — Ambulatory Visit: Payer: PRIVATE HEALTH INSURANCE | Admitting: Physical Therapy

## 2014-12-04 DIAGNOSIS — M25661 Stiffness of right knee, not elsewhere classified: Secondary | ICD-10-CM

## 2014-12-04 DIAGNOSIS — M25561 Pain in right knee: Secondary | ICD-10-CM

## 2014-12-04 DIAGNOSIS — R269 Unspecified abnormalities of gait and mobility: Secondary | ICD-10-CM

## 2014-12-04 NOTE — Therapy (Signed)
[facility] [address] [place], [place], [address] Phone: [phone]   Fax:  [phone]  Physical Therapy Treatment  Patient Details  Name: [name] MRN: [id number] Date of Birth: [birth year] Referring Provider:  Samuel Jester, DO  Encounter Date: 12/04/2014      PT End of Session - 12/04/14 1327    Visit Number 3   Number of Visits 12   Date for PT Re-Evaluation 01/24/15   PT Start Time 0102   PT Stop Time 0200   PT Time Calculation (min) 58 min   Activity Tolerance Patient tolerated treatment well   Behavior During Therapy [facility] for tasks assessed/performed      Past Medical History  Diagnosis Date  . Bipolar disorder   . Migraine   . Depression   . GERD (gastroesophageal reflux disease)   . Arthritis   . Osteoporosis   . Hypothyroidism   . Pneumonia 1992  . Bruises easily     Past Surgical History  Procedure Laterality Date  . Appendectomy  1974  . Knee arthroscopy Right 1982  . Carpal tunnel release Right 1985  . Orif ankle fracture Left   . Dilation and curettage of uterus      x 3  . Colonoscopy    . Total knee arthroplasty Right 03/28/2013    Dr Cleophas Dunker  . Total knee arthroplasty Right 03/28/2013    Procedure: TOTAL KNEE ARTHROPLASTY;  Surgeon: Valeria Batman, MD;  Location: [facility] OR;  Service: Orthopedics;  Laterality: Right;  . Total knee revision Right 11/06/2014    Procedure: TOTAL KNEE REVISION;  Surgeon: Valeria Batman, MD;  Location: [facility] OR;  Service: Orthopedics;  Laterality: Right;    There were no vitals filed for this visit.  Visit Diagnosis:  Right knee pain  Knee stiffness, right  Abnormality of gait      Subjective Assessment - 12/04/14 1308    Subjective Walking with cane now.   Limitations Sitting;Standing;Walking   How long can you sit comfortably? 20 minutes.   How long can you stand comfortably? 20 minutes.   How long can you walk comfortably? 20 minutes.   Patient  Stated Goals get out of pain and walk good again without assistance.   Pain Score 4    Pain Location Knee   Pain Orientation Right   Pain Descriptors / Indicators Aching   Pain Type Surgical pain   Pain Frequency Constant                                      PT Long Term Goals - 11/29/14 1522    PT LONG TERM GOAL #1   Title Ind with HEP.   Time 6   Period Weeks   Status New   PT LONG TERM GOAL #2   Title Achieve full acive right knee extension to normalize gait.   Time 6   Period Weeks   Status New   PT LONG TERM GOAL #3   Title Active right knee flexion to 115 degrees to increase function.   Time 6   Period Weeks   Status New   PT LONG TERM GOAL #4   Title 5/5 right knee strength to increase stability.   Time 6   Period Weeks   Status New   PT LONG TERM GOAL #5   Title Perform a reciprocating stair gait with  one railing.   Time 6   Period Weeks   Status New   Additional Long Term Goals   Additional Long Term Goals Yes   PT LONG TERM GOAL #6   Title Walk a community distance without assistive device with pain not > 3/10.   Time 6   Period Weeks   Status New     Treatment:  Stationary bike x 15 minutes  SAQ's x 15 minutes facilitated with VMS to right quadriceps with 10 second extension holds and 10 second rest  Elevation and Pre-mod e' stim (1-10HZ ) x 15 minutes with medium vasopneumatic  Right knee AROM:  -15 to 100 degrees.          Problem List Patient Active Problem List   Diagnosis Date Noted  . Loose total knee arthroplasty 11/06/2014  . 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 Primary osteoarthritis of knee 11/06/2014  . Osteoarthritis of right knee 03/30/2013    Gopal Malter, [place] MPT 12/04/2014, 2:01 PM  [facility] [address] [place], [place], [address] Phone: [phone]   Fax:  [phone]

## 2014-12-06 ENCOUNTER — Ambulatory Visit: Payer: PRIVATE HEALTH INSURANCE | Admitting: Physical Therapy

## 2014-12-06 ENCOUNTER — Encounter: Payer: Self-pay | Admitting: Physical Therapy

## 2014-12-06 DIAGNOSIS — R269 Unspecified abnormalities of gait and mobility: Secondary | ICD-10-CM

## 2014-12-06 DIAGNOSIS — M25661 Stiffness of right knee, not elsewhere classified: Secondary | ICD-10-CM

## 2014-12-06 DIAGNOSIS — M25561 Pain in right knee: Secondary | ICD-10-CM | POA: Diagnosis not present

## 2014-12-06 NOTE — Therapy (Signed)
[facility] [address] [place], [place], [address] Phone: [phone]   Fax:  [phone]  Physical Therapy Treatment  Patient Details  Name: [name] MRN: [id number] Date of Birth: 11-04-1955 Referring Provider:  Samuel Jester, DO  Encounter Date: 12/06/2014      PT End of Session - 12/06/14 1307    Visit Number 4   Number of Visits 12   Date for PT Re-Evaluation 01/24/15   PT Start Time 1303   PT Stop Time 1412   PT Time Calculation (min) 69 min   Equipment Utilized During Treatment Other (comment)  SPC   Activity Tolerance Patient tolerated treatment well   Behavior During Therapy [facility] for tasks assessed/performed      Past Medical History  Diagnosis Date  . Bipolar disorder   . Migraine   . Depression   . GERD (gastroesophageal reflux disease)   . Arthritis   . Osteoporosis   . Hypothyroidism   . Pneumonia 1992  . Bruises easily     Past Surgical History  Procedure Laterality Date  . Appendectomy  1974  . Knee arthroscopy Right 1982  . Carpal tunnel release Right 1985  . Orif ankle fracture Left   . Dilation and curettage of uterus      x 3  . Colonoscopy    . Total knee arthroplasty Right 03/28/2013    Dr Cleophas Dunker  . Total knee arthroplasty Right 03/28/2013    Procedure: TOTAL KNEE ARTHROPLASTY;  Surgeon: Valeria Batman, MD;  Location: [facility] OR;  Service: Orthopedics;  Laterality: Right;  . Total knee revision Right 11/06/2014    Procedure: TOTAL KNEE REVISION;  Surgeon: Valeria Batman, MD;  Location: [facility] OR;  Service: Orthopedics;  Laterality: Right;    There were no vitals filed for this visit.  Visit Diagnosis:  Right knee pain  Knee stiffness, right  Abnormality of gait      Subjective Assessment - 12/06/14 1305    Subjective Reports that Dr. Eustaquio Maize would like for her to work on R knee extension and was very impressed with R knee flexion.    Limitations Sitting;Standing;Walking   How long can you sit comfortably? 20 minutes.   How long can you stand comfortably? 20 minutes.   How long can you walk comfortably? 20 minutes.   Patient Stated Goals get out of pain and walk good again without assistance.   Currently in Pain? Yes   Pain Score 3    Pain Location Knee   Pain Orientation Right   Pain Descriptors / Indicators Aching   Pain Type Surgical pain   Pain Frequency Intermittent            [facility] PT Assessment - 12/06/14 0001    Assessment   Medical Diagnosis S/p revision right total kne replacement.   Onset Date/Surgical Date 11/06/14   Next MD Visit 12/2014                     [facility] Adult PT Treatment/Exercise - 12/06/14 0001    Knee/Hip Exercises: Stretches   Active Hamstring Stretch 3 reps;30 seconds;Other (comment)  in sitting with foot propped on cart for HEP   Knee/Hip Exercises: Aerobic   Stationary Bike x10 min   Knee/Hip Exercises: Standing   Rocker Board 4 minutes   Knee/Hip Exercises: Supine   Short Arc Quad Sets Strengthening;Right;Other (comment)  with VMS to increase R quad activation   Modalities   Modalities  Cryotherapy;Electrical Stimulation   Cryotherapy   Number Minutes Cryotherapy 15 Minutes   Cryotherapy Location Knee   Type of Cryotherapy Other (comment)  vasopnuematic   Electrical Stimulation   Electrical Stimulation Location R knee   Electrical Stimulation Action VMS with SAQ; IFC   Electrical Stimulation Parameters 10/10 x15 min; 1-10 Hz x15 min   Electrical Stimulation Goals Strength;Edema;Pain   Manual Therapy   Manual Therapy Passive ROM;Soft tissue mobilization   Soft tissue mobilization Instructed patient regarding scar massage and the techniques for each   Passive ROM R knee into flex/ext with gentle holds at end range                PT Education - 12/06/14 1312    Education provided Yes   Education Details HEP- Hamstring stretch in sitting, prone hang to improve R knee extension; given  written notes on HEP regarding scar mobilization technqiues up/down, side/side, circlular motions x5 min several times per day   Person(s) Educated Patient   Methods Explanation;Demonstration;Verbal cues;Handout;Tactile cues   Comprehension Verbalized understanding;Returned demonstration;Verbal cues required;Tactile cues required             PT Long Term Goals - 12/06/14 1349    PT LONG TERM GOAL #1   Title Ind with HEP.   Time 6   Period Weeks   Status On-going   PT LONG TERM GOAL #2   Title Achieve full acive right knee extension to normalize gait.   Time 6   Period Weeks   Status On-going   PT LONG TERM GOAL #3   Title Active right knee flexion to 115 degrees to increase function.   Time 6   Period Weeks   Status On-going   PT LONG TERM GOAL #4   Title 5/5 right knee strength to increase stability.   Time 6   Period Weeks   Status On-going   PT LONG TERM GOAL #5   Title Perform a reciprocating stair gait with one railing.   Time 6   Period Weeks   Status On-going   PT LONG TERM GOAL #6   Title Walk a community distance without assistive device with pain not > 3/10.   Time 6   Period Weeks   Status On-going               Plan - 12/06/14 1346    Clinical Impression Statement Patient tolerated treatment well today with focus on ROM especially R knee extension. Accepted new extension stretching and scar mobilization HEP without questions. Ambulated into therapy gym assisted by [facility]. All LT goals remain on-going at this time secondary to decreased R knee ROM, strength, use of assistive device. Continues to demonstrate R quad atrophy during VMS strengthening. Normal modalties response noted following removal of the modatlies. Denied pain following treatment.   Pt will benefit from skilled therapeutic intervention in order to improve on the following deficits Pain;Decreased activity tolerance;Decreased range of motion;Decreased strength   Rehab Potential Good   PT  Frequency 3x / week   PT Duration 6 weeks   PT Treatment/Interventions ADLs/Self Care Home Management;Cryotherapy;Electrical Stimulation;Gait training;Stair training;Functional mobility training;Therapeutic exercise;Therapeutic activities;Neuromuscular re-education;Patient/family education;Manual techniques;Vasopneumatic Device   PT Next Visit Plan Continue per MPT and TKR protocol. Focus on extension per Dr. Cleophas Dunker.   Consulted and Agree with Plan of Care Patient        Problem List Patient Active Problem List   Diagnosis Date Noted  . Loose total knee arthroplasty 11/06/2014  .  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 Primary osteoarthritis of knee 11/06/2014  . Osteoarthritis of right knee 03/30/2013    Evelene Croon, PTA 12/06/2014, 2:18 PM  [facility] [address] [place], [place], [address] Phone: [phone]   Fax:  [phone]

## 2014-12-06 NOTE — Patient Instructions (Signed)
Stretching: Hamstring (Sitting)   With right leg straight, tuck other foot near groin. Reach down until stretch is felt in back of thigh. Keep back straight. Hold _30___ seconds. Repeat __3__ times per set. Do __1__ sets per session. Do _3___ sessions per day.  http://orth.exer.us/660   Copyright  VHI. All rights reserved.  Knee Extension Mobilization: Hang (Prone)   With table supporting thighs, place ____ pound weight on right ankle. Hold _3___ minutes. Repeat _3___ times per set. Do _1___ sets per session. Do _3___ sessions per day.  http://orth.exer.us/722   Copyright  VHI. All rights reserved.

## 2014-12-10 ENCOUNTER — Ambulatory Visit: Payer: PRIVATE HEALTH INSURANCE | Admitting: *Deleted

## 2014-12-10 ENCOUNTER — Encounter: Payer: Self-pay | Admitting: *Deleted

## 2014-12-10 DIAGNOSIS — M25561 Pain in right knee: Secondary | ICD-10-CM

## 2014-12-10 DIAGNOSIS — M25661 Stiffness of right knee, not elsewhere classified: Secondary | ICD-10-CM

## 2014-12-10 DIAGNOSIS — R269 Unspecified abnormalities of gait and mobility: Secondary | ICD-10-CM

## 2014-12-10 NOTE — Therapy (Signed)
[facility] [place], [place], [address] Phone: [phone]   Fax:  [phone]  Physical Therapy Treatment  Patient Details  Name: [name] MRN: [id number] Date of Birth: 28-Feb-1956 Referring Provider:  Octavio Graves, DO  Encounter Date: 12/10/2014      PT End of Session - 12/10/14 1320    Visit Number 5   Number of Visits 12   Date for PT Re-Evaluation 01/24/15   PT Start Time 1300   PT Stop Time 1351   PT Time Calculation (min) 51 min      Past Medical History  Diagnosis Date  . Bipolar disorder   . Migraine   . Depression   . GERD (gastroesophageal reflux disease)   . Arthritis   . Osteoporosis   . Hypothyroidism   . Pneumonia 1992  . Bruises easily     Past Surgical History  Procedure Laterality Date  . Appendectomy  1974  . Knee arthroscopy Right 1982  . Carpal tunnel release Right 1985  . Orif ankle fracture Left   . Dilation and curettage of uterus      x 3  . Colonoscopy    . Total knee arthroplasty Right 03/28/2013    Dr Durward Fortes  . Total knee arthroplasty Right 03/28/2013    Procedure: TOTAL KNEE ARTHROPLASTY;  Surgeon: Garald Balding, MD;  Location: [place];  Service: Orthopedics;  Laterality: Right;  . Total knee revision Right 11/06/2014    Procedure: TOTAL KNEE REVISION;  Surgeon: Garald Balding, MD;  Location: [place];  Service: Orthopedics;  Laterality: Right;    There were no vitals filed for this visit.  Visit Diagnosis:  Right knee pain  Knee stiffness, right  Abnormality of gait      Subjective Assessment - 12/10/14 1304    Subjective Reports that Dr. Ignacia Bayley would like for her to work on R knee extension and was very impressed with R knee flexion.    Limitations Sitting;Standing;Walking   How long can you sit comfortably? 20 minutes.   How long can you stand comfortably? 20 minutes.   How long can you walk comfortably? 20 minutes.   Patient Stated Goals get  out of pain and walk good again without assistance.   Pain Score 2    Pain Location Knee   Pain Orientation Right   Pain Descriptors / Indicators Aching   Pain Type Surgical pain   Pain Frequency Intermittent                         [facility] Adult PT Treatment/Exercise - 12/10/14 0001    Exercises   Exercises Knee/Hip   Knee/Hip Exercises: Aerobic   Stationary Bike Nustep L4 x 12 mins    Knee/Hip Exercises: Standing   Rocker Board 4 minutes  calf stretching   Electrical Stimulation   Electrical Stimulation Location R knee   Electrical Stimulation Action VMS 10 sec on/off with quad sets and LLLDext stretch   Electrical Stimulation Parameters x 10 mins   Electrical Stimulation Goals Tone   Manual Therapy   Manual Therapy Passive ROM;Soft tissue mobilization;Myofascial release   Soft tissue mobilization patella and scar mobs    Myofascial Release IASTM to posterior aspect RT knee, calf , and HS   Passive ROM R knee into ext with gentle holds at end range  PT Long Term Goals - 12/06/14 1349    PT LONG TERM GOAL #1   Title Ind with HEP.   Time 6   Period Weeks   Status On-going   PT LONG TERM GOAL #2   Title Achieve full acive right knee extension to normalize gait.   Time 6   Period Weeks   Status On-going   PT LONG TERM GOAL #3   Title Active right knee flexion to 115 degrees to increase function.   Time 6   Period Weeks   Status On-going   PT LONG TERM GOAL #4   Title 5/5 right knee strength to increase stability.   Time 6   Period Weeks   Status On-going   PT LONG TERM GOAL #5   Title Perform a reciprocating stair gait with one railing.   Time 6   Period Weeks   Status On-going   PT LONG TERM GOAL #6   Title Walk a community distance without assistive device with pain not > 3/10.   Time 6   Period Weeks   Status On-going               Plan - 12/10/14 1321    Clinical Impression Statement Pt did well  with Rx today and felt that the posterior aspect loosened up after STW and LLLD stretching and I encouraged her to cont with extension stretching at home. Notable tightness in calf and HS's. No new LTGs  met today   Pt will benefit from skilled therapeutic intervention in order to improve on the following deficits Pain;Decreased activity tolerance;Decreased range of motion;Decreased strength   Rehab Potential Good   PT Frequency 3x / week   PT Duration 6 weeks   PT Treatment/Interventions ADLs/Self Care Home Management;Cryotherapy;Electrical Stimulation;Gait training;Stair training;Functional mobility training;Therapeutic exercise;Therapeutic activities;Neuromuscular re-education;Patient/family education;Manual techniques;Vasopneumatic Device   PT Next Visit Plan Continue per MPT and TKR protocol. Focus on extension per Dr. Durward Fortes. IASTM posterior aspect   Consulted and Agree with Plan of Care Patient        Problem List Patient Active Problem List   Diagnosis Date Noted  . Loose total knee arthroplasty 11/06/2014  . 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 Primary osteoarthritis of knee 11/06/2014  . Osteoarthritis of right knee 03/30/2013    RAMSEUR,CHRIS, PTA 12/10/2014, 2:10 PM  [facility] [address] [place], [place], [address] Phone: [phone]   Fax:  [phone]

## 2014-12-12 ENCOUNTER — Ambulatory Visit: Payer: PRIVATE HEALTH INSURANCE | Admitting: Physical Therapy

## 2014-12-12 DIAGNOSIS — M25561 Pain in right knee: Secondary | ICD-10-CM

## 2014-12-12 DIAGNOSIS — M25661 Stiffness of right knee, not elsewhere classified: Secondary | ICD-10-CM

## 2014-12-12 DIAGNOSIS — R269 Unspecified abnormalities of gait and mobility: Secondary | ICD-10-CM

## 2014-12-12 NOTE — Therapy (Signed)
[facility] [address] [place], [place], [address] Phone: [phone]   Fax:  [phone]  Physical Therapy Treatment  Patient Details  Name: [name] MRN: [id number] Date of Birth: 1955/08/30 Referring Provider:  Samuel Jester, DO  Encounter Date: 12/12/2014      PT End of Session - 12/12/14 1455    Visit Number 6   Number of Visits 12   Date for PT Re-Evaluation 01/24/15   PT Start Time 0100   PT Stop Time 0152   PT Time Calculation (min) 52 min   Equipment Utilized During Treatment Other (comment)   Activity Tolerance Patient tolerated treatment well   Behavior During Therapy [facility] for tasks assessed/performed      Past Medical History  Diagnosis Date  . Bipolar disorder   . Migraine   . Depression   . GERD (gastroesophageal reflux disease)   . Arthritis   . Osteoporosis   . Hypothyroidism   . Pneumonia 1992  . Bruises easily     Past Surgical History  Procedure Laterality Date  . Appendectomy  1974  . Knee arthroscopy Right 1982  . Carpal tunnel release Right 1985  . Orif ankle fracture Left   . Dilation and curettage of uterus      x 3  . Colonoscopy    . Total knee arthroplasty Right 03/28/2013    Dr Cleophas Dunker  . Total knee arthroplasty Right 03/28/2013    Procedure: TOTAL KNEE ARTHROPLASTY;  Surgeon: Valeria Batman, MD;  Location: [facility] OR;  Service: Orthopedics;  Laterality: Right;  . Total knee revision Right 11/06/2014    Procedure: TOTAL KNEE REVISION;  Surgeon: Valeria Batman, MD;  Location: [facility] OR;  Service: Orthopedics;  Laterality: Right;    There were no vitals filed for this visit.  Visit Diagnosis:  Right knee pain  Knee stiffness, right  Abnormality of gait      Subjective Assessment - 12/12/14 1451    Subjective Doing well today.  I'm doing the prone hang exercise at home.   Limitations Sitting;Standing;Walking   How long can you sit comfortably? 20 minutes.   How long can  you stand comfortably? 20 minutes.   How long can you walk comfortably? 20 minutes.   Patient Stated Goals get out of pain and walk good again without assistance.   Pain Score 2    Pain Location Knee   Pain Orientation Right   Pain Descriptors / Indicators Aching   Pain Type Surgical pain   Pain Frequency Intermittent                         [facility] Adult PT Treatment/Exercise - 12/12/14 0001    Knee/Hip Exercises: Aerobic   Stationary Bike Stationary bike at seat 8 x 15 minutes.   Knee/Hip Exercises: Supine   Short Arc Quad Sets Limitations 15 minutes with 2# facilitated with VMS with 10 second extension holds and 10 second rest   Modalities   Modalities Cryotherapy;Electrical Stimulation   Cryotherapy   Number Minutes Cryotherapy 15 Minutes   Cryotherapy Location --  Right knee.   Type of Cryotherapy --  Medium vasopneumatic.   Programme researcher, broadcasting/film/video Location --  Right knee.   Electrical Stimulation Action --  IFC x 15 minutes.   Electrical Stimulation Goals Pain   Manual Therapy   Passive ROM Right knee into ext and flex x 8 minutes.  PT Long Term Goals - 12/06/14 1349    PT LONG TERM GOAL #1   Title Ind with HEP.   Time 6   Period Weeks   Status On-going   PT LONG TERM GOAL #2   Title Achieve full acive right knee extension to normalize gait.   Time 6   Period Weeks   Status On-going   PT LONG TERM GOAL #3   Title Active right knee flexion to 115 degrees to increase function.   Time 6   Period Weeks   Status On-going   PT LONG TERM GOAL #4   Title 5/5 right knee strength to increase stability.   Time 6   Period Weeks   Status On-going   PT LONG TERM GOAL #5   Title Perform a reciprocating stair gait with one railing.   Time 6   Period Weeks   Status On-going   PT LONG TERM GOAL #6   Title Walk a community distance without assistive device with pain not > 3/10.   Time 6   Period  Weeks   Status On-going               Problem List Patient Active Problem List   Diagnosis Date Noted  . Loose total knee arthroplasty 11/06/2014  . 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 Primary osteoarthritis of knee 11/06/2014  . Osteoarthritis of right knee 03/30/2013    APPLEGATE, [place] MPT 12/12/2014, 2:58 PM  [facility] [address] [place], [place], [address] Phone: [phone]   Fax:  [phone]

## 2014-12-13 ENCOUNTER — Ambulatory Visit: Payer: PRIVATE HEALTH INSURANCE | Admitting: Physical Therapy

## 2014-12-13 DIAGNOSIS — M25561 Pain in right knee: Secondary | ICD-10-CM | POA: Diagnosis not present

## 2014-12-13 DIAGNOSIS — R269 Unspecified abnormalities of gait and mobility: Secondary | ICD-10-CM

## 2014-12-13 DIAGNOSIS — M25661 Stiffness of right knee, not elsewhere classified: Secondary | ICD-10-CM

## 2014-12-13 NOTE — Therapy (Signed)
[facility] [address] [place], [place], [address] Phone: [phone]   Fax:  [phone]  Physical Therapy Treatment  Patient Details  Name: [name] MRN: [id number] Date of Birth: 1955-08-03 Referring Provider:  Samuel Jester, DO  Encounter Date: 12/13/2014      PT End of Session - 12/13/14 1424    Visit Number 7   Number of Visits 12   Date for PT Re-Evaluation 01/24/15   PT Start Time 0100   PT Stop Time 0159   PT Time Calculation (min) 59 min   Activity Tolerance Patient tolerated treatment well   Behavior During Therapy [facility] for tasks assessed/performed      Past Medical History  Diagnosis Date  . Bipolar disorder   . Migraine   . Depression   . GERD (gastroesophageal reflux disease)   . Arthritis   . Osteoporosis   . Hypothyroidism   . Pneumonia 1992  . Bruises easily     Past Surgical History  Procedure Laterality Date  . Appendectomy  1974  . Knee arthroscopy Right 1982  . Carpal tunnel release Right 1985  . Orif ankle fracture Left   . Dilation and curettage of uterus      x 3  . Colonoscopy    . Total knee arthroplasty Right 03/28/2013    Dr Cleophas Dunker  . Total knee arthroplasty Right 03/28/2013    Procedure: TOTAL KNEE ARTHROPLASTY;  Surgeon: Valeria Batman, MD;  Location: [facility] OR;  Service: Orthopedics;  Laterality: Right;  . Total knee revision Right 11/06/2014    Procedure: TOTAL KNEE REVISION;  Surgeon: Valeria Batman, MD;  Location: [facility] OR;  Service: Orthopedics;  Laterality: Right;    There were no vitals filed for this visit.  Visit Diagnosis:  Right knee pain  Knee stiffness, right  Abnormality of gait      Subjective Assessment - 12/13/14 1310    Subjective Pleased with my progress.   Limitations Sitting;Standing;Walking   How long can you sit comfortably? 20 minutes.   How long can you walk comfortably? 20 minutes.   Patient Stated Goals get out of pain and walk good  again without assistance.            [facility] PT Assessment - 12/13/14 0001    ROM / Strength   AROM / PROM / Strength PROM   PROM   Overall PROM Comments -9 degrees to 105 degrees.                     [facility] Adult PT Treatment/Exercise - 12/13/14 0001    Exercises   Exercises Knee/Hip   Knee/Hip Exercises: Aerobic   Stationary Bike Beginning at seat 8 and moving to seat 6 over 15 minutes.   Knee/Hip Exercises: Supine   Short Arc Quad Sets Limitations 15 minutes with 2#--SAQ's (10 second extension holds and 10 second rests).   Cryotherapy   Number Minutes Cryotherapy 15 Minutes   Cryotherapy Location --  Right knee.   Type of Cryotherapy --  Medium vasopneumatic.   Programme researcher, broadcasting/film/video Location --  Right knee.   Electrical Stimulation Action --  IFC   Electrical Stimulation Parameters --  1-10 HZ x .   Electrical Stimulation Goals Edema;Pain   Manual Therapy   Manual therapy comments 8 minutes of PROM with focus on right knee extension.  PT Long Term Goals - 12/13/14 1425    PT LONG TERM GOAL #1   Title Ind with HEP.   Time 6   Period Weeks   Status On-going   PT LONG TERM GOAL #2   Title Achieve full acive right knee extension to normalize gait.   Time 6   Period Weeks   Status On-going   PT LONG TERM GOAL #3   Title Active right knee flexion to 115 degrees to increase function.   Time 6   Period Weeks   Status On-going   PT LONG TERM GOAL #4   Title 5/5 right knee strength to increase stability.   Time 6   Period Weeks   Status On-going               Plan - 12/13/14 1425    Clinical Impression Statement The patient is progressing well with her treatments and is well pleased thus far.   Pt will benefit from skilled therapeutic intervention in order to improve on the following deficits Pain;Decreased activity tolerance;Decreased range of motion;Decreased strength    Rehab Potential Good   PT Frequency 3x / week   PT Duration 6 weeks   PT Treatment/Interventions ADLs/Self Care Home Management;Cryotherapy;Electrical Stimulation;Gait training;Stair training;Functional mobility training;Therapeutic exercise;Therapeutic activities;Neuromuscular re-education;Patient/family education;Manual techniques;Vasopneumatic Device   PT Next Visit Plan Continue per MPT and TKR protocol. Focus on extension per Dr. Cleophas Dunker. IASTM posterior aspect        Problem List Patient Active Problem List   Diagnosis Date Noted  . Loose total knee arthroplasty 11/06/2014  . 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 Primary osteoarthritis of knee 11/06/2014  . Osteoarthritis of right knee 03/30/2013    [name], [place] MPT 12/13/2014, 2:29 PM  [facility] [address] [place], [place], [address] Phone: [phone]   Fax:  [phone]

## 2014-12-17 ENCOUNTER — Ambulatory Visit: Payer: PRIVATE HEALTH INSURANCE | Admitting: Physical Therapy

## 2014-12-17 ENCOUNTER — Encounter: Payer: Self-pay | Admitting: Physical Therapy

## 2014-12-17 DIAGNOSIS — M25661 Stiffness of right knee, not elsewhere classified: Secondary | ICD-10-CM

## 2014-12-17 DIAGNOSIS — M25561 Pain in right knee: Secondary | ICD-10-CM | POA: Diagnosis not present

## 2014-12-17 DIAGNOSIS — R269 Unspecified abnormalities of gait and mobility: Secondary | ICD-10-CM

## 2014-12-17 NOTE — Therapy (Signed)
[facility] [address] [place], [place], [address] Phone: [phone]   Fax:  [phone]  Physical Therapy Treatment  Patient Details  Name: [name] MRN: [id number] Date of Birth: [birth year] Referring Provider:  Samuel Jester, DO  Encounter Date: 12/17/2014      PT End of Session - 12/17/14 1306    Visit Number 8   Number of Visits 12   Date for PT Re-Evaluation 01/24/15   PT Start Time 1301   PT Stop Time 1348   PT Time Calculation (min) 47 min   Equipment Utilized During Treatment Other (comment)  SPC   Activity Tolerance Patient tolerated treatment well   Behavior During Therapy [facility] for tasks assessed/performed      Past Medical History  Diagnosis Date  . Bipolar disorder   . Migraine   . Depression   . GERD (gastroesophageal reflux disease)   . Arthritis   . Osteoporosis   . Hypothyroidism   . Pneumonia 1992  . Bruises easily     Past Surgical History  Procedure Laterality Date  . Appendectomy  1974  . Knee arthroscopy Right 1982  . Carpal tunnel release Right 1985  . Orif ankle fracture Left   . Dilation and curettage of uterus      x 3  . Colonoscopy    . Total knee arthroplasty Right 03/28/2013    Dr Cleophas Dunker  . Total knee arthroplasty Right 03/28/2013    Procedure: TOTAL KNEE ARTHROPLASTY;  Surgeon: Valeria Batman, MD;  Location: [facility] OR;  Service: Orthopedics;  Laterality: Right;  . Total knee revision Right 11/06/2014    Procedure: TOTAL KNEE REVISION;  Surgeon: Valeria Batman, MD;  Location: [facility] OR;  Service: Orthopedics;  Laterality: Right;    There were no vitals filed for this visit.  Visit Diagnosis:  Right knee pain  Knee stiffness, right  Abnormality of gait      Subjective Assessment - 12/17/14 1304    Subjective States that she was standing and riding in a car this morning due to a funeral in [place] and reports she is really stiff. Was trying to stop taking the pain  medication but took some prior to therapy.   Limitations Sitting;Standing;Walking   How long can you sit comfortably? 20 minutes.   How long can you stand comfortably? 20 minutes.   How long can you walk comfortably? 20 minutes.   Patient Stated Goals get out of pain and walk good again without assistance.   Currently in Pain? Yes   Pain Score 5    Pain Location Knee   Pain Orientation Right   Pain Descriptors / Indicators Other (Comment)  Stiff   Pain Type Surgical pain            [facility] PT Assessment - 12/17/14 0001    Assessment   Medical Diagnosis S/p revision right total kne replacement.   Onset Date/Surgical Date 11/06/14   Next MD Visit 12/2014   ROM / Strength   AROM / PROM / Strength PROM   PROM   Overall PROM  Deficits   PROM Assessment Site Knee   Right/Left Knee Right   Right Knee   Right Knee Extension -4   Right Knee Flexion 104   Palpation   Patella mobility Demonstrates good R patella mobility in all directions                     [facility] Adult PT  Treatment/Exercise - 12/17/14 0001    Knee/Hip Exercises: Stretches   Active Hamstring Stretch Right;3 reps;30 seconds   Knee: Self-Stretch to increase Flexion Right;3 reps;30 seconds   Knee/Hip Exercises: Aerobic   Stationary Bike L1 x10 min   Knee/Hip Exercises: Standing   Forward Step Up Right;3 sets;10 reps;Step Height: 6"   Rocker Board 2 minutes   Knee/Hip Exercises: Supine   Short Arc Quad Sets Strengthening;Right;Other (comment)  with VMS and 2# x15 min   Modalities   Modalities Insurance account managerlectrical Stimulation   Electrical Stimulation   Electrical Stimulation Location R knee   Electrical Stimulation Action VMS with SAQ   Electrical Stimulation Parameters 10/10 x15 min and 2#    Electrical Stimulation Goals Strength   Manual Therapy   Manual Therapy Passive ROM;Soft tissue mobilization   Soft tissue mobilization R patellar mobilizations in all directions, tilts   Passive ROM R knee into  flex/ext with gentle holds at end range                     PT Long Term Goals - 12/17/14 1307    PT LONG TERM GOAL #1   Title Ind with HEP.   Time 6   Period Weeks   Status Achieved   PT LONG TERM GOAL #2   Title Achieve full acive right knee extension to normalize gait.   Time 6   Period Weeks   Status On-going   PT LONG TERM GOAL #3   Title Active right knee flexion to 115 degrees to increase function.   Time 6   Period Weeks   Status On-going   PT LONG TERM GOAL #4   Title 5/5 right knee strength to increase stability.   Time 6   Period Weeks   Status On-going   PT LONG TERM GOAL #5   Title Perform a reciprocating stair gait with one railing.   Time 6   Period Weeks   Status On-going   PT LONG TERM GOAL #6   Title Walk a community distance without assistive device with pain not > 3/10.   Time 6   Period Weeks   Status On-going               Plan - 12/17/14 1339    Clinical Impression Statement Patient tolerated treatment well today with all exercises and activities completed without complaint of pain or soreness. Acheived HEP goal today all other remain on-going secondary to decreased ROM, strength, use of assistive device. PROM of R knee measured at 4-104 deg today. Normal modalties response noted following removal of the modalities. Denied pain following treatment.   Pt will benefit from skilled therapeutic intervention in order to improve on the following deficits Pain;Decreased activity tolerance;Decreased range of motion;Decreased strength   Rehab Potential Good   PT Frequency 3x / week   PT Duration 6 weeks   PT Treatment/Interventions ADLs/Self Care Home Management;Cryotherapy;Electrical Stimulation;Gait training;Stair training;Functional mobility training;Therapeutic exercise;Therapeutic activities;Neuromuscular re-education;Patient/family education;Manual techniques;Vasopneumatic Device   PT Next Visit Plan Continue per MPT and TKR  protocol. Focus on extension per Dr. Cleophas DunkerWhitfield. IASTM posterior aspect   Consulted and Agree with Plan of Care Patient        Problem List Patient Active Problem List   Diagnosis Date Noted  . Loose total knee arthroplasty 11/06/2014  . 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 Primary osteoarthritis of knee 11/06/2014  . Osteoarthritis of right knee 03/30/2013    Adelina MingsKelsey  Henrietta Dine, PTA 12/17/2014, 1:52 PM  [facility] [address] [place], [place], [address] Phone: [phone]   Fax:  [phone]

## 2014-12-19 ENCOUNTER — Encounter: Payer: Self-pay | Admitting: Physical Therapy

## 2014-12-19 ENCOUNTER — Ambulatory Visit: Payer: PRIVATE HEALTH INSURANCE | Admitting: Physical Therapy

## 2014-12-19 DIAGNOSIS — M25561 Pain in right knee: Secondary | ICD-10-CM

## 2014-12-19 DIAGNOSIS — R269 Unspecified abnormalities of gait and mobility: Secondary | ICD-10-CM

## 2014-12-19 DIAGNOSIS — M25661 Stiffness of right knee, not elsewhere classified: Secondary | ICD-10-CM

## 2014-12-19 NOTE — Therapy (Signed)
[facility] [address] [place], [place], [address] Phone: [phone]   Fax:  [phone]  Physical Therapy Treatment  Patient Details  Name: [name] MRN: [id number] Date of Birth: 12-19-1955 Referring Provider:  Samuel JesterButler, Cynthia, DO  Encounter Date: 12/19/2014      PT End of Session - 12/19/14 1306    Visit Number 9   Number of Visits 12   Date for PT Re-Evaluation 01/24/15   PT Start Time 1301   PT Stop Time 1416   PT Time Calculation (min) 75 min   Equipment Utilized During Treatment Other (comment)  SPC   Activity Tolerance Patient tolerated treatment well   Behavior [facility] Therapy [facility] for tasks assessed/performed      Past Medical History  Diagnosis Date  . Bipolar disorder   . Migraine   . Depression   . GERD (gastroesophageal reflux disease)   . Arthritis   . Osteoporosis   . Hypothyroidism   . Pneumonia 1992  . Bruises easily     Past Surgical History  Procedure Laterality Date  . Appendectomy  1974  . Knee arthroscopy Right 1982  . Carpal tunnel release Right 1985  . Orif ankle fracture Left   . Dilation and curettage of uterus      x 3  . Colonoscopy    . Total knee arthroplasty Right 03/28/2013    Dr Cleophas DunkerWhitfield  . Total knee arthroplasty Right 03/28/2013    Procedure: TOTAL KNEE ARTHROPLASTY;  Surgeon: Valeria BatmanPeter W Whitfield, MD;  [facility];  Service: Orthopedics;  Laterality: Right;  . Total knee revision Right 11/06/2014    Procedure: TOTAL KNEE REVISION;  Surgeon: Valeria BatmanPeter W Whitfield, MD;  [facility];  Service: Orthopedics;  Laterality: Right;    There were no vitals filed for this visit.  Visit Diagnosis:  Right knee pain  Abnormality of gait  Knee stiffness, right      Subjective Assessment - 12/19/14 1305    Subjective States that she has contacted her neurologist regarding tremors and they are going to try increasing the dose again to assist in eliminating tremors. Is really  frustrated by tremors and feels like she could do more if she didn't have the tremors.   Limitations Sitting;Standing;Walking   How long can you sit comfortably? 30-45 min   How long can you stand comfortably? 20 minutes.   How long can you walk comfortably? 20 minutes or more   Patient Stated Goals get out of pain and walk good again without assistance.   Currently in Pain? No/denies            [facility] - 12/19/14 0001    Assessment   Medical Diagnosis S/p revision right total kne replacement.   Onset Date/Surgical Date 11/06/14   Next MD Visit 12/2014   ROM / Strength   AROM / PROM / Strength AROM;PROM   AROM   Overall AROM  Deficits   AROM Assessment Site Knee   Right/Left Knee Right   Right Knee Extension 4   Right Knee Flexion 104   PROM   Overall PROM  Deficits   Overall PROM Comments 2- 110   PROM Assessment Site Knee   Right/Left Knee Right                     [facility] Adult PT Treatment/Exercise - 12/19/14 0001    Exercises   Exercises Knee/Hip   Knee/Hip Exercises: Stretches  Active Hamstring Stretch Right;3 reps;30 seconds   Knee: Self-Stretch to increase Flexion Right;3 reps;30 seconds   Knee/Hip Exercises: Aerobic   Stationary Bike L1 x10 min   Knee/Hip Exercises: Standing   Lateral Step Up Right;3 sets;10 reps;Step Height: 6"   Forward Step Up Right;3 sets;10 reps;Step Height: 6"   Rocker Board 3 minutes   Knee/Hip Exercises: Supine   Short Arc Quad Sets Strengthening;Right;Other (comment)  with VMS to increase R knee activation 3#   Modalities   Modalities Cryotherapy;Electrical Stimulation   Cryotherapy   Number Minutes Cryotherapy 15 Minutes   Cryotherapy Location Knee   Type of Cryotherapy Other (comment)  Medium Vasopneumatic   Electrical Stimulation   Electrical Stimulation Location R knee   Electrical Stimulation Action VMS with SAQ 3#   Electrical Stimulation Parameters 10/10 x15 min   Electrical Stimulation Goals  Strength   Manual Therapy   Manual Therapy Passive ROM;Soft tissue mobilization   Soft tissue mobilization R knee scar mobilization  Slightly tighter in inferior aspect of the scar   Passive ROM R knee into flex/ext with gentle holds at end range                     PT Long Term Goals - 12/17/14 1307    PT LONG TERM GOAL #1   Title Ind with HEP.   Time 6   Period Weeks   Status Achieved   PT LONG TERM GOAL #2   Title Achieve full acive right knee extension to normalize gait.   Time 6   Period Weeks   Status On-going   PT LONG TERM GOAL #3   Title Active right knee flexion to 115 degrees to increase function.   Time 6   Period Weeks   Status On-going   PT LONG TERM GOAL #4   Title 5/5 right knee strength to increase stability.   Time 6   Period Weeks   Status On-going   PT LONG TERM GOAL #5   Title Perform a reciprocating stair gait with one railing.   Time 6   Period Weeks   Status On-going   PT LONG TERM GOAL #6   Title Walk a community distance without assistive device with pain not > 3/10.   Time 6   Period Weeks   Status On-going               Plan - 12/19/14 1351    Clinical Impression Statement Patient continues to tolerate treatment and advance in exercises without complaint of pain. All goals remain on-going at this time secondary to decreased R knee ROM, strength, use of assistive device, and inability to maneuver stairs at this time. AROM of R knee was measured today at 4-104 deg, PROM 2-110 deg. Normal modalites response noted following removal of the modalities. Denied pain following treatment.   Pt will benefit from skilled therapeutic intervention in order to improve on the following deficits Pain;Decreased activity tolerance;Decreased range of motion;Decreased strength   Rehab Potential Good   PT Frequency 3x / week   PT Duration 6 weeks   PT Treatment/Interventions ADLs/Self Care Home Management;Cryotherapy;Electrical  Stimulation;Gait training;Stair training;Functional mobility training;Therapeutic exercise;Therapeutic activities;Neuromuscular re-education;Patient/family education;Manual techniques;Vasopneumatic Device   PT Next Visit Plan Continue per MPT and TKR protocol. Focus on extension per Dr. Cleophas Dunker. IASTM posterior aspect   Consulted and Agree with Plan of Care Patient        Problem List Patient Active Problem List   Diagnosis  Date Noted  . Loose total knee arthroplasty 11/06/2014  . 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 Primary osteoarthritis of knee 11/06/2014  . Osteoarthritis of right knee 03/30/2013    Evelene Croon, PTA 12/19/2014, 2:19 PM  [facility] [address] [place], [place], [address] Phone: [phone]   Fax:  [phone]

## 2014-12-20 ENCOUNTER — Ambulatory Visit: Payer: PRIVATE HEALTH INSURANCE | Admitting: Physical Therapy

## 2014-12-20 DIAGNOSIS — M25561 Pain in right knee: Secondary | ICD-10-CM

## 2014-12-20 DIAGNOSIS — M25661 Stiffness of right knee, not elsewhere classified: Secondary | ICD-10-CM

## 2014-12-20 DIAGNOSIS — R269 Unspecified abnormalities of gait and mobility: Secondary | ICD-10-CM

## 2014-12-20 NOTE — Therapy (Signed)
[facility] [address] [place], [place], [address] Phone: [phone]   Fax:  [phone]  Physical Therapy Treatment  Patient Details  Name: Kathryn StallsDebra L C Marhefka MRN: [id number] Date of Birth: 08-27-1955 Referring Provider:  Samuel JesterButler, Cynthia, DO  Encounter Date: 12/20/2014      PT End of Session - 12/20/14 1307    Visit Number 10   Number of Visits 12   Date for PT Re-Evaluation 01/24/15   PT Start Time 1305   PT Stop Time 1350   PT Time Calculation (min) 45 min   Equipment Utilized During Treatment Other (comment)  SPC   Activity Tolerance Patient tolerated treatment well   Behavior During Therapy [facility] for tasks assessed/performed      Past Medical History  Diagnosis Date  . Bipolar disorder   . Migraine   . Depression   . GERD (gastroesophageal reflux disease)   . Arthritis   . Osteoporosis   . Hypothyroidism   . Pneumonia 1992  . Bruises easily     Past Surgical History  Procedure Laterality Date  . Appendectomy  1974  . Knee arthroscopy Right 1982  . Carpal tunnel release Right 1985  . Orif ankle fracture Left   . Dilation and curettage of uterus      x 3  . Colonoscopy    . Total knee arthroplasty Right 03/28/2013    Dr Cleophas DunkerWhitfield  . Total knee arthroplasty Right 03/28/2013    Procedure: TOTAL KNEE ARTHROPLASTY;  Surgeon: Valeria BatmanPeter W Whitfield, MD;  [facility];  Service: Orthopedics;  Laterality: Right;  . Total knee revision Right 11/06/2014    Procedure: TOTAL KNEE REVISION;  Surgeon: Valeria BatmanPeter W Whitfield, MD;  [facility];  Service: Orthopedics;  Laterality: Right;    There were no vitals filed for this visit.  Visit Diagnosis:  Right knee pain  Abnormality of gait  Knee stiffness, right      Subjective Assessment - 12/20/14 1307    Subjective States that she went to feed her horses today and walked to the stable fine. The walk from her house to the stable is down hill.   Limitations  Sitting;Standing;Walking   How long can you sit comfortably? 30-45 min   How long can you stand comfortably? 20 minutes.   How long can you walk comfortably? 20 minutes or more   Patient Stated Goals get out of pain and walk good again without assistance.   Currently in Pain? No/denies            [facility] - 12/20/14 0001    Assessment   Medical Diagnosis S/p revision right total kne replacement.   Onset Date/Surgical Date 11/06/14   Next MD Visit 12/2014   ROM / Strength   AROM / PROM / Strength PROM   PROM   Overall PROM  Deficits   Overall PROM Comments 2-112   PROM Assessment Site Knee   Right/Left Knee Right                     [facility] Adult PT Treatment/Exercise - 12/20/14 0001    Knee/Hip Exercises: Stretches   Active Hamstring Stretch Right;3 reps;30 seconds   Knee: Self-Stretch to increase Flexion Right;3 reps;30 seconds   Knee/Hip Exercises: Aerobic   Stationary Bike x5 min   Knee/Hip Exercises: Standing   Forward Step Up Right;Step Height: 6";Hand Hold: 2  x25 reps   Modalities   Modalities Electrical Stimulation  Tree surgeon VMS with SAQ 3#   Electrical Stimulation Parameters 10/10 x15 mi   Electrical Stimulation Goals Strength   Manual Therapy   Manual Therapy Passive ROM   Passive ROM R knee into flex/ext with gentle holds at end range                     PT Long Term Goals - 12/20/14 1337    PT LONG TERM GOAL #1   Title Ind with HEP.   Time 6   Period Weeks   Status Achieved   PT LONG TERM GOAL #2   Title Achieve full acive right knee extension to normalize gait.   Time 6   Period Weeks   Status On-going   PT LONG TERM GOAL #3   Title Active right knee flexion to 115 degrees to increase function.   Time 6   Period Weeks   Status On-going   PT LONG TERM GOAL #4   Title 5/5 right knee strength to increase stability.   Time  6   Period Weeks   Status On-going   PT LONG TERM GOAL #5   Title Perform a reciprocating stair gait with one railing.   Time 6   Period Weeks   Status Achieved   PT LONG TERM GOAL #6   Title Walk a community distance without assistive device with pain not > 3/10.   Time 6   Period Weeks   Status On-going               Plan - 12/20/14 1336    Clinical Impression Statement Patient continues to tolerated treatment well and continues to advance in exercises without complaint of pain. All goals are on-going secondary to decreased R knee AROM, strength, use of SPC. Achieved LT goal of reciprical stair gait with one railing goal today per patient report of completion. PROM R knee flexion improved by 2 deg since previous treatment 12/19/2014. Denied pain following treamtent.   Pt will benefit from skilled therapeutic intervention in order to improve on the following deficits Pain;Decreased activity tolerance;Decreased range of motion;Decreased strength   Rehab Potential Good   PT Frequency 3x / week   PT Duration 6 weeks   PT Treatment/Interventions ADLs/Self Care Home Management;Cryotherapy;Electrical Stimulation;Gait training;Stair training;Functional mobility training;Therapeutic exercise;Therapeutic activities;Neuromuscular re-education;Patient/family education;Manual techniques;Vasopneumatic Device   PT Next Visit Plan Continue per MPT and TKR protocol. Focus on extension per Dr. Cleophas Dunker. IASTM posterior aspect   Consulted and Agree with Plan of Care Patient        Problem List Patient Active Problem List   Diagnosis Date Noted  . Loose total knee arthroplasty 11/06/2014  . 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 Primary osteoarthritis of knee 11/06/2014  . Osteoarthritis of right knee 03/30/2013    Evelene Croon, PTA 12/20/2014, 1:54 PM  [facility] [address] [place], [place],  [address] Phone: [phone]   Fax:  [phone]

## 2014-12-25 ENCOUNTER — Ambulatory Visit: Payer: PRIVATE HEALTH INSURANCE | Attending: Orthopaedic Surgery | Admitting: Physical Therapy

## 2014-12-25 ENCOUNTER — Encounter: Payer: Self-pay | Admitting: Physical Therapy

## 2014-12-25 DIAGNOSIS — R269 Unspecified abnormalities of gait and mobility: Secondary | ICD-10-CM

## 2014-12-25 DIAGNOSIS — M25561 Pain in right knee: Secondary | ICD-10-CM | POA: Insufficient documentation

## 2014-12-25 DIAGNOSIS — M25661 Stiffness of right knee, not elsewhere classified: Secondary | ICD-10-CM | POA: Insufficient documentation

## 2014-12-25 NOTE — Therapy (Signed)
[facility] [address] [place], [place], [address] Phone: [phone]   Fax:  [phone]  Physical Therapy Treatment  Patient Details  Name: [name] MRN: [id number] Date of Birth: [birth year] Referring Provider:  Samuel Jester, DO  Encounter Date: 12/25/2014      PT End of Session - 12/25/14 1352    Visit Number 11   Number of Visits 12   Date for PT Re-Evaluation 01/24/15   PT Start Time 1346   PT Stop Time 1439   PT Time Calculation (min) 53 min   Equipment Utilized During Treatment Other (comment)  SPC   Activity Tolerance Patient tolerated treatment well   Behavior During Therapy [facility] for tasks assessed/performed      Past Medical History  Diagnosis Date  . Bipolar disorder   . Migraine   . Depression   . GERD (gastroesophageal reflux disease)   . Arthritis   . Osteoporosis   . Hypothyroidism   . Pneumonia 1992  . Bruises easily     Past Surgical History  Procedure Laterality Date  . Appendectomy  1974  . Knee arthroscopy Right 1982  . Carpal tunnel release Right 1985  . Orif ankle fracture Left   . Dilation and curettage of uterus      x 3  . Colonoscopy    . Total knee arthroplasty Right 03/28/2013    Dr Cleophas Dunker  . Total knee arthroplasty Right 03/28/2013    Procedure: TOTAL KNEE ARTHROPLASTY;  Surgeon: Valeria Batman, MD;  Location: [facility] OR;  Service: Orthopedics;  Laterality: Right;  . Total knee revision Right 11/06/2014    Procedure: TOTAL KNEE REVISION;  Surgeon: Valeria Batman, MD;  Location: [facility] OR;  Service: Orthopedics;  Laterality: Right;    There were no vitals filed for this visit.  Visit Diagnosis:  Right knee pain  Abnormality of gait  Knee stiffness, right      Subjective Assessment - 12/25/14 1351    Subjective Reports that she to walk around more than usual due to going to get her dog.   Limitations Sitting;Standing;Walking   How long can you sit comfortably? 30-45  min   How long can you stand comfortably? 20 minutes.   How long can you walk comfortably? 20 minutes or more   Patient Stated Goals get out of pain and walk good again without assistance.   Currently in Pain? Yes   Pain Score 3    Pain Location Knee   Pain Orientation Right   Pain Descriptors / Indicators Aching;Sore   Pain Type Surgical pain            [facility] PT Assessment - 12/25/14 0001    Assessment   Medical Diagnosis S/p revision right total kne replacement.   Onset Date/Surgical Date 11/06/14   Next MD Visit 12/2014   ROM / Strength   AROM / PROM / Strength AROM   AROM   Overall AROM  Deficits   AROM Assessment Site Knee   Right/Left Knee Right   Right Knee Extension 6   Right Knee Flexion 108                     [facility] Adult PT Treatment/Exercise - 12/25/14 0001    Knee/Hip Exercises: Stretches   Active Hamstring Stretch Right;3 reps;30 seconds   Knee/Hip Exercises: Aerobic   Stationary Bike L1 x8 min   Knee/Hip Exercises: Standing   Forward Lunges Right;3 sets;10 reps  Forward Step Up Right;3 sets;10 reps;Step Height: 6"   Rocker Board 3 minutes   Knee/Hip Exercises: Supine   Short Arc Quad Sets Strengthening;Right;Other (comment)  with VMS 4# for R quad activation   Modalities   Modalities Quarry managerlectrical Stimulation   Electrical Stimulation   Electrical Stimulation Location R VMO/ Careers information officerQuad   Electrical Stimulation Action VMS with SAQ   Electrical Stimulation Parameters 10/10 x15 min; 4#   Electrical Stimulation Goals Strength   Manual Therapy   Manual Therapy Passive ROM   Passive ROM R knee into flex/ext with gentle holds at end range                     PT Long Term Goals - 12/20/14 1337    PT LONG TERM GOAL #1   Title Ind with HEP.   Time 6   Period Weeks   Status Achieved   PT LONG TERM GOAL #2   Title Achieve full acive right knee extension to normalize gait.   Time 6   Period Weeks   Status On-going   PT LONG TERM  GOAL #3   Title Active right knee flexion to 115 degrees to increase function.   Time 6   Period Weeks   Status On-going   PT LONG TERM GOAL #4   Title 5/5 right knee strength to increase stability.   Time 6   Period Weeks   Status On-going   PT LONG TERM GOAL #5   Title Perform a reciprocating stair gait with one railing.   Time 6   Period Weeks   Status Achieved   PT LONG TERM GOAL #6   Title Walk a community distance without assistive device with pain not > 3/10.   Time 6   Period Weeks   Status On-going               Plan - 12/25/14 1425    Clinical Impression Statement Patient continues to tolerate treatment well with only beginning of pain towards the end of extension PROM. All goals remain secondary at this time secondary to decreased R knee AROM, strength, use of assistive device. AROM of R knee measured 6-108 deg today in clinic. Normal modalities response noted following removal of the modalities. Denied pain following treatment.   Pt will benefit from skilled therapeutic intervention in order to improve on the following deficits Pain;Decreased activity tolerance;Decreased range of motion;Decreased strength   Rehab Potential Good   PT Frequency 3x / week   PT Duration 6 weeks   PT Treatment/Interventions ADLs/Self Care Home Management;Cryotherapy;Electrical Stimulation;Gait training;Stair training;Functional mobility training;Therapeutic exercise;Therapeutic activities;Neuromuscular re-education;Patient/family education;Manual techniques;Vasopneumatic Device   PT Next Visit Plan Continue per MPT and TKR protocol. Focus on extension per Dr. Cleophas DunkerWhitfield. IASTM posterior aspect   Consulted and Agree with Plan of Care Patient        Problem List Patient Active Problem List   Diagnosis Date Noted  . Loose total knee arthroplasty 11/06/2014  . 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 Primary osteoarthritis of knee 11/06/2014  .  Osteoarthritis of right knee 03/30/2013    Evelene CroonKelsey M Parsons, PTA 12/25/2014, 2:42 PM  [facility] [address] [place], [place], [address] Phone: [phone]   Fax:  [phone]

## 2014-12-27 ENCOUNTER — Ambulatory Visit: Payer: PRIVATE HEALTH INSURANCE | Admitting: Physical Therapy

## 2014-12-27 ENCOUNTER — Encounter: Payer: Self-pay | Admitting: Physical Therapy

## 2014-12-27 DIAGNOSIS — M25561 Pain in right knee: Secondary | ICD-10-CM | POA: Diagnosis not present

## 2014-12-27 DIAGNOSIS — M25661 Stiffness of right knee, not elsewhere classified: Secondary | ICD-10-CM

## 2014-12-27 DIAGNOSIS — R269 Unspecified abnormalities of gait and mobility: Secondary | ICD-10-CM

## 2014-12-27 NOTE — Therapy (Signed)
[facility] [address] [place], [place], [address] Phone: [phone]   Fax:  [phone]  Physical Therapy Treatment  Patient Details  Name: [name] MRN: [id number] Date of Birth: 1956/03/26 Referring Provider:  Samuel Jester, DO  Encounter Date: 12/27/2014      PT End of Session - 12/27/14 1307    Visit Number 12   Number of Visits 12   Date for PT Re-Evaluation 01/24/15   PT Start Time 1307   PT Stop Time 1351   PT Time Calculation (min) 44 min   Equipment Utilized During Treatment Other (comment)  SPC   Activity Tolerance Patient tolerated treatment well   Behavior During Therapy [facility] for tasks assessed/performed      Past Medical History  Diagnosis Date  . Bipolar disorder   . Migraine   . Depression   . GERD (gastroesophageal reflux disease)   . Arthritis   . Osteoporosis   . Hypothyroidism   . Pneumonia 1992  . Bruises easily     Past Surgical History  Procedure Laterality Date  . Appendectomy  1974  . Knee arthroscopy Right 1982  . Carpal tunnel release Right 1985  . Orif ankle fracture Left   . Dilation and curettage of uterus      x 3  . Colonoscopy    . Total knee arthroplasty Right 03/28/2013    Dr Cleophas Dunker  . Total knee arthroplasty Right 03/28/2013    Procedure: TOTAL KNEE ARTHROPLASTY;  Surgeon: Valeria Batman, MD;  Location: [facility] OR;  Service: Orthopedics;  Laterality: Right;  . Total knee revision Right 11/06/2014    Procedure: TOTAL KNEE REVISION;  Surgeon: Valeria Batman, MD;  Location: [facility] OR;  Service: Orthopedics;  Laterality: Right;    There were no vitals filed for this visit.  Visit Diagnosis:  Right knee pain  Abnormality of gait  Knee stiffness, right      Subjective Assessment - 12/27/14 1310    Subjective Reports some stiffness in affected knee today. Continues to complete prone hangs and other extension exercises directed to her.   Limitations  Sitting;Standing;Walking   How long can you sit comfortably? 30-45 min   How long can you stand comfortably? 20 minutes.   How long can you walk comfortably? 20 minutes or more   Patient Stated Goals get out of pain and walk good again without assistance.   Currently in Pain? Yes   Pain Score 2    Pain Location Knee   Pain Orientation Right   Pain Descriptors / Indicators Other (Comment)  Stiffness   Pain Type Surgical pain            [facility] PT Assessment - 12/27/14 0001    Assessment   Medical Diagnosis S/p revision right total kne replacement.   Onset Date/Surgical Date 11/06/14   Next MD Visit 01/02/2015   ROM / Strength   AROM / PROM / Strength AROM   AROM   Overall AROM  Deficits   AROM Assessment Site Knee   Right/Left Knee Right   Right Knee Extension 2   Right Knee Flexion 108                     [facility] Adult PT Treatment/Exercise - 12/27/14 0001    Knee/Hip Exercises: Stretches   Active Hamstring Stretch Right;3 reps;30 seconds   Gastroc Stretch 3 reps;30 seconds;Other (comment)  Rockerboard as slant board   Knee/Hip Exercises: Aerobic  Stationary Bike L1 x5 min   Knee/Hip Exercises: Standing   Forward Step Up Right;3 sets;10 reps;Step Height: 6"   Knee/Hip Exercises: Supine   Short Arc Quad Sets Strengthening;Right;Other (comment)  with VMS and SAQ for R quad activation   Modalities   Modalities Quarry managerlectrical Stimulation   Electrical Stimulation   Electrical Stimulation Location R VMO/ Careers information officerQuad   Electrical Stimulation Action VMS with SAQ 4#   Electrical Stimulation Parameters 10/10 x15 min    Electrical Stimulation Goals Strength   Manual Therapy   Manual Therapy Passive ROM   Passive ROM R knee into flex/ext with gentle holds at end range                     PT Long Term Goals - 12/20/14 1337    PT LONG TERM GOAL #1   Title Ind with HEP.   Time 6   Period Weeks   Status Achieved   PT LONG TERM GOAL #2   Title Achieve full  acive right knee extension to normalize gait.   Time 6   Period Weeks   Status On-going   PT LONG TERM GOAL #3   Title Active right knee flexion to 115 degrees to increase function.   Time 6   Period Weeks   Status On-going   PT LONG TERM GOAL #4   Title 5/5 right knee strength to increase stability.   Time 6   Period Weeks   Status On-going   PT LONG TERM GOAL #5   Title Perform a reciprocating stair gait with one railing.   Time 6   Period Weeks   Status Achieved   PT LONG TERM GOAL #6   Title Walk a community distance without assistive device with pain not > 3/10.   Time 6   Period Weeks   Status On-going               Plan - 12/27/14 1337    Clinical Impression Statement Patient continues to tolerate treatment  well without complaint of pain during exercises. AROM of R knee improved today 2-108 deg in clinic. Requires minimal verbal cueing for correct sequence of forward step ups and has occasional R hip hike to compensate during foreward step ups. All goals remain on-going secondary to decreased AROM, strength, use of assistive device. Normal modalities response noted following removal of the modalities. Experienced R knee "feeling good" following treatment.   Pt will benefit from skilled therapeutic intervention in order to improve on the following deficits Pain;Decreased activity tolerance;Decreased range of motion;Decreased strength   Rehab Potential Good   PT Frequency 3x / week   PT Duration 6 weeks   PT Treatment/Interventions ADLs/Self Care Home Management;Cryotherapy;Electrical Stimulation;Gait training;Stair training;Functional mobility training;Therapeutic exercise;Therapeutic activities;Neuromuscular re-education;Patient/family education;Manual techniques;Vasopneumatic Device   PT Next Visit Plan Continue per MPT and TKR protocol. Focus on extension per Dr. Cleophas DunkerWhitfield. Sees Dr. Cleophas DunkerWhitfield 01/02/2015.   Consulted and Agree with Plan of Care Patient         Problem List Patient Active Problem List   Diagnosis Date Noted  . Loose total knee arthroplasty 11/06/2014  . 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 Primary osteoarthritis of knee 11/06/2014  . Osteoarthritis of right knee 03/30/2013    Florence CannerKelsey Parsons, PTA 12/27/2014 1:53 PM  [facility] [address] [place], [place], [address] Phone: [phone]   Fax:  [phone]

## 2014-12-30 NOTE — Addendum Note (Signed)
Addended by: Dillie Burandt, ItalyHAD W on: 12/30/2014 03:17 PM   Modules accepted: Orders

## 2014-12-31 ENCOUNTER — Ambulatory Visit: Payer: PRIVATE HEALTH INSURANCE | Admitting: Physical Therapy

## 2014-12-31 DIAGNOSIS — M25561 Pain in right knee: Secondary | ICD-10-CM

## 2014-12-31 DIAGNOSIS — M25661 Stiffness of right knee, not elsewhere classified: Secondary | ICD-10-CM

## 2014-12-31 DIAGNOSIS — R269 Unspecified abnormalities of gait and mobility: Secondary | ICD-10-CM

## 2014-12-31 NOTE — Therapy (Signed)
[facility] [address] [place], [place], [address] Phone: [phone]   Fax:  [phone]  Physical Therapy Treatment  Patient Details  Name: [name] MRN: [id number] Date of Birth: 1955-09-29 Referring Provider:  Samuel Jester, DO  Encounter Date: Jan 08, 202016      PT End of Session - 12/31/14 1416    Visit Number 13   Date for PT Re-Evaluation 01/24/15   PT Start Time 0100   PT Stop Time 0152   PT Time Calculation (min) 52 min      Past Medical History  Diagnosis Date  . Bipolar disorder   . Migraine   . Depression   . GERD (gastroesophageal reflux disease)   . Arthritis   . Osteoporosis   . Hypothyroidism   . Pneumonia 1992  . Bruises easily     Past Surgical History  Procedure Laterality Date  . Appendectomy  1974  . Knee arthroscopy Right 1982  . Carpal tunnel release Right 1985  . Orif ankle fracture Left   . Dilation and curettage of uterus      x 3  . Colonoscopy    . Total knee arthroplasty Right 03/28/2013    Dr Cleophas Dunker  . Total knee arthroplasty Right 03/28/2013    Procedure: TOTAL KNEE ARTHROPLASTY;  Surgeon: Valeria Batman, MD;  Location: [facility] OR;  Service: Orthopedics;  Laterality: Right;  . Total knee revision Right 11/06/2014    Procedure: TOTAL KNEE REVISION;  Surgeon: Valeria Batman, MD;  Location: [facility] OR;  Service: Orthopedics;  Laterality: Right;    There were no vitals filed for this visit.  Visit Diagnosis:  Right knee pain  Abnormality of gait  Knee stiffness, right      Subjective Assessment - 12/31/14 1315    Subjective Been up and around today. Pain rated at 2/10.   Limitations Sitting;Standing;Walking   How long can you sit comfortably? 30-45 min   How long can you stand comfortably? 20 minutes.   Patient Stated Goals get out of pain and walk good again without assistance.   Currently in Pain? Yes   Pain Score 2    Pain Location Knee   Pain Orientation Right   Pain Descriptors / Indicators Sore   Pain Type Surgical pain   Pain Frequency Intermittent                         [facility] Adult PT Treatment/Exercise - 12/31/14 0001    Exercises   Exercises Knee/Hip   Knee/Hip Exercises: Aerobic   Stationary Bike Level 1 x 17 minutes   Knee/Hip Exercises: Machines for Strengthening   Cybex Knee Extension 10# x 5 minutes.   Modalities   Modalities Electrical Stimulation   Cryotherapy   Number Minutes Cryotherapy 15 Minutes   Type of Cryotherapy --  Medium vasopneumatic.   Programme researcher, broadcasting/film/video Action Pre-mod e 'stim x 15 minutes.   Manual Therapy   Manual therapy comments 3 minutes into right knee extension.                     PT Long Term Goals - 12/20/14 1337    PT LONG TERM GOAL #1   Title Ind with HEP.   Time 6   Period Weeks   Status Achieved   PT LONG TERM GOAL #2   Title Achieve full acive right knee extension to normalize gait.   Time  6   Period Weeks   Status On-going   PT LONG TERM GOAL #3   Title Active right knee flexion to 115 degrees to increase function.   Time 6   Period Weeks   Status On-going   PT LONG TERM GOAL #4   Title 5/5 right knee strength to increase stability.   Time 6   Period Weeks   Status On-going   PT LONG TERM GOAL #5   Title Perform a reciprocating stair gait with one railing.   Time 6   Period Weeks   Status Achieved   PT LONG TERM GOAL #6   Title Walk a community distance without assistive device with pain not > 3/10.   Time 6   Period Weeks   Status On-going               Plan - 12/31/14 1323    PT Next Visit Plan MD note.        Problem List Patient Active Problem List   Diagnosis Date Noted  . Loose total knee arthroplasty 11/06/2014  . 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 Primary osteoarthritis of knee 11/06/2014  . Osteoarthritis of right knee 03/30/2013    APPLEGATE,  ItalyHAD MPT 12-26-2014, 2:19 PM  [facility] [address] [place], [place], [address] Phone: [phone]   Fax:  [phone]

## 2015-01-01 ENCOUNTER — Ambulatory Visit: Payer: PRIVATE HEALTH INSURANCE | Admitting: Physical Therapy

## 2015-01-01 ENCOUNTER — Encounter: Payer: Self-pay | Admitting: Physical Therapy

## 2015-01-01 DIAGNOSIS — M25561 Pain in right knee: Secondary | ICD-10-CM | POA: Diagnosis not present

## 2015-01-01 DIAGNOSIS — R269 Unspecified abnormalities of gait and mobility: Secondary | ICD-10-CM

## 2015-01-01 DIAGNOSIS — M25661 Stiffness of right knee, not elsewhere classified: Secondary | ICD-10-CM

## 2015-01-01 NOTE — Therapy (Signed)
[facility] [address] [place], [place], [address] Phone: [phone]   Fax:  [phone]  Physical Therapy Treatment  Patient Details  Name: [name] MRN: [id number] Date of Birth: Oct 11, 1955 Referring Provider:  Samuel Jester, DO  Encounter Date: 01/01/2015      PT End of Session - 01/01/15 1305    Visit Number 14   Number of Visits 12   Date for PT Re-Evaluation 01/24/15   PT Start Time 1302   PT Stop Time 1352   PT Time Calculation (min) 50 min   Equipment Utilized During Treatment Other (comment)   Activity Tolerance Patient tolerated treatment well   Behavior During Therapy [facility] for tasks assessed/performed      Past Medical History  Diagnosis Date  . Bipolar disorder   . Migraine   . Depression   . GERD (gastroesophageal reflux disease)   . Arthritis   . Osteoporosis   . Hypothyroidism   . Pneumonia 1992  . Bruises easily     Past Surgical History  Procedure Laterality Date  . Appendectomy  1974  . Knee arthroscopy Right 1982  . Carpal tunnel release Right 1985  . Orif ankle fracture Left   . Dilation and curettage of uterus      x 3  . Colonoscopy    . Total knee arthroplasty Right 03/28/2013    Dr Cleophas Dunker  . Total knee arthroplasty Right 03/28/2013    Procedure: TOTAL KNEE ARTHROPLASTY;  Surgeon: Valeria Batman, MD;  Location: [facility] OR;  Service: Orthopedics;  Laterality: Right;  . Total knee revision Right 11/06/2014    Procedure: TOTAL KNEE REVISION;  Surgeon: Valeria Batman, MD;  Location: [facility] OR;  Service: Orthopedics;  Laterality: Right;    There were no vitals filed for this visit.  Visit Diagnosis:  Right knee pain  Abnormality of gait  Knee stiffness, right      Subjective Assessment - 01/01/15 1304    Subjective Reports she has been up walking a lot today and has a little pain.   Limitations Sitting;Standing;Walking   How long can you sit comfortably? 30-45 min   How long  can you stand comfortably? 20 minutes.   How long can you walk comfortably? 20 minutes or more   Patient Stated Goals get out of pain and walk good again without assistance.   Currently in Pain? Yes   Pain Score 2    Pain Location Knee   Pain Orientation Right   Pain Descriptors / Indicators Sore   Pain Type Surgical pain            [facility] PT Assessment - 01/01/15 0001    Assessment   Medical Diagnosis S/p revision right total kne replacement.   Onset Date/Surgical Date 11/06/14   Next MD Visit 01/02/2015   ROM / Strength   AROM / PROM / Strength AROM;Strength   AROM   Overall AROM  Deficits   AROM Assessment Site Knee   Right/Left Knee Right   Right Knee Extension 2   Right Knee Flexion 108   Strength   Overall Strength Deficits   Strength Assessment Site Knee   Right/Left Knee Right   Right Knee Flexion 4/5   Right Knee Extension 4+/5                     [facility] Adult PT Treatment/Exercise - 01/01/15 0001    Knee/Hip Exercises: Aerobic   Stationary Bike L1  x12 min   Knee/Hip Exercises: Machines for Strengthening   Cybex Knee Extension 10# 3 x10 reps   Knee/Hip Exercises: Standing   Forward Step Up Right;3 sets;10 reps;Hand Hold: 2;Step Height: 6"   Step Down Right;2 sets;10 reps;Hand Hold: 2;Step Height: 4"   Modalities   Modalities Office manager Location R knee    Electrical Stimulation Action Pre-Mod   Electrical Stimulation Parameters 80-150 Hz x15 min   Electrical Stimulation Goals Pain   Vasopneumatic   Number Minutes Vasopneumatic  15 minutes   Vasopnuematic Location  Knee   Vasopneumatic Pressure Medium   Vasopneumatic Temperature  55   Manual Therapy   Manual Therapy Passive ROM;Soft tissue mobilization   Soft tissue mobilization R patellar mobilizations in all directions in supine; R knee incision scar mobilizations along entire incision in all directions   Passive  ROM R knee into flex/ext with gentle holds at end range                     PT Long Term Goals - 12/20/14 1337    PT LONG TERM GOAL #1   Title Ind with HEP.   Time 6   Period Weeks   Status Achieved   PT LONG TERM GOAL #2   Title Achieve full acive right knee extension to normalize gait.   Time 6   Period Weeks   Status On-going   PT LONG TERM GOAL #3   Title Active right knee flexion to 115 degrees to increase function.   Time 6   Period Weeks   Status On-going   PT LONG TERM GOAL #4   Title 5/5 right knee strength to increase stability.   Time 6   Period Weeks   Status On-going   PT LONG TERM GOAL #5   Title Perform a reciprocating stair gait with one railing.   Time 6   Period Weeks   Status Achieved   PT LONG TERM GOAL #6   Title Walk a community distance without assistive device with pain not > 3/10.   Time 6   Period Weeks   Status On-going               Plan - 01/01/15 1336    Clinical Impression Statement Patient tolerated treatment well today without complaint of pain with new exercises to strengthen the R knee extensors. MMT of R knee extensors 4+/5, knee flexors 4/5. R knee AROM measured at 2-108 deg today in clinic. Continues to use of single point cane outside the home secondary to uneasiness and uneven ground. Normal modalittes response noted following removal of the modalties.    Pt will benefit from skilled therapeutic intervention in order to improve on the following deficits Pain;Decreased activity tolerance;Decreased range of motion;Decreased strength   Rehab Potential Good   PT Frequency 3x / week   PT Duration 6 weeks   PT Treatment/Interventions ADLs/Self Care Home Management;Cryotherapy;Electrical Stimulation;Gait training;Stair training;Functional mobility training;Therapeutic exercise;Therapeutic activities;Neuromuscular re-education;Patient/family education;Manual techniques;Vasopneumatic Device   PT Next Visit Plan Progress  per MPT POC/ TKR protocol/ MD discretion.   Consulted and Agree with Plan of Care Patient        Problem List Patient Active Problem List   Diagnosis Date Noted  . Loose total knee arthroplasty 11/06/2014  . 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 Primary osteoarthritis of knee 11/06/2014  . Osteoarthritis of right knee 03/30/2013  Evelene CroonKelsey M Parsons, PTA 01/01/2015, 3:38 PM  [facility] [address] [place], [place], [address] Phone: [phone]   Fax:  [phone]

## 2015-01-07 ENCOUNTER — Ambulatory Visit: Payer: PRIVATE HEALTH INSURANCE | Admitting: Physical Therapy

## 2015-01-07 ENCOUNTER — Encounter: Payer: Self-pay | Admitting: Physical Therapy

## 2015-01-07 DIAGNOSIS — R269 Unspecified abnormalities of gait and mobility: Secondary | ICD-10-CM

## 2015-01-07 DIAGNOSIS — M25661 Stiffness of right knee, not elsewhere classified: Secondary | ICD-10-CM

## 2015-01-07 DIAGNOSIS — M25561 Pain in right knee: Secondary | ICD-10-CM | POA: Diagnosis not present

## 2015-01-07 NOTE — Therapy (Signed)
[facility] [address] [place], [place], [address] Phone: [phone]   Fax:  [phone]  Physical Therapy Treatment  Patient Details  Name: [name] MRN: [id number] Date of Birth: Nov 16, 1955 Referring Provider:  Samuel JesterButler, Cynthia, DO  Encounter Date: 01/07/2015      PT End of Session - 01/07/15 1303    Visit Number 15   Number of Visits 20  per MPT request since patient reported that the MD said that PT would know how many more visits she needs.   Date for PT Re-Evaluation 01/24/15   PT Start Time 1300   PT Stop Time 1354   PT Time Calculation (min) 54 min   Equipment Utilized During Treatment Other (comment)  SPC   Activity Tolerance Patient tolerated treatment well   Behavior During Therapy [facility] for tasks assessed/performed      Past Medical History  Diagnosis Date  . Bipolar disorder   . Migraine   . Depression   . GERD (gastroesophageal reflux disease)   . Arthritis   . Osteoporosis   . Hypothyroidism   . Pneumonia 1992  . Bruises easily     Past Surgical History  Procedure Laterality Date  . Appendectomy  1974  . Knee arthroscopy Right 1982  . Carpal tunnel release Right 1985  . Orif ankle fracture Left   . Dilation and curettage of uterus      x 3  . Colonoscopy    . Total knee arthroplasty Right 03/28/2013    Dr Cleophas DunkerWhitfield  . Total knee arthroplasty Right 03/28/2013    Procedure: TOTAL KNEE ARTHROPLASTY;  Surgeon: Valeria BatmanPeter W Whitfield, MD;  [facility];  Service: Orthopedics;  Laterality: Right;  . Total knee revision Right 11/06/2014    Procedure: TOTAL KNEE REVISION;  Surgeon: Valeria BatmanPeter W Whitfield, MD;  [facility];  Service: Orthopedics;  Laterality: Right;    There were no vitals filed for this visit.  Visit Diagnosis:  Right knee pain  Abnormality of gait  Knee stiffness, right      Subjective Assessment - 01/07/15 1302    Subjective Reports that her knee feels good and mounted and  rode a horse over the weekend but she said that would be the only time she would ride a horse until she got completely well with knee. States that she uses SPC mainly outdoors.   Limitations Sitting;Standing;Walking   How long can you sit comfortably? 30-45 min   How long can you stand comfortably? 20 minutes.   How long can you walk comfortably? 20 minutes or more   Patient Stated Goals get out of pain and walk good again without assistance.   Currently in Pain? No/denies            [facility] - 01/07/15 0001    Assessment   Medical Diagnosis S/p revision right total kne replacement.   Onset Date/Surgical Date 11/06/14   Next MD Visit 01/2015   ROM / Strength   AROM / PROM / Strength AROM   AROM   Overall AROM  Deficits   AROM Assessment Site Knee   Right/Left Knee Right   Right Knee Extension 0   Right Knee Flexion 107                     [facility] Adult PT Treatment/Exercise - 01/07/15 0001    Knee/Hip Exercises: Aerobic   Stationary Bike L1 x11 min   Knee/Hip Exercises: Machines for  Strengthening   Cybex Knee Extension 10# 3 x10 reps   Knee/Hip Exercises: Standing   Forward Step Up Right;3 sets;10 reps;Hand Hold: 2;Step Height: 6"   Step Down Right;3 sets;10 reps;Step Height: 4";Hand Hold: 2   Rocker Board 3 minutes   Modalities   Modalities Office manager Location R knee    Electrical Stimulation Action IFC   Electrical Stimulation Parameters 1-10 Hz x15 min   Electrical Stimulation Goals Pain   Vasopneumatic   Number Minutes Vasopneumatic  15 minutes   Vasopnuematic Location  Knee   Vasopneumatic Pressure Low   Vasopneumatic Temperature  34   Manual Therapy   Manual Therapy Passive ROM;Soft tissue mobilization   Soft tissue mobilization R patellar mobilizations in all directions in supine;    Passive ROM R knee into flex/ext with gentle holds at end range                      PT Long Term Goals - 01/07/15 1356    PT LONG TERM GOAL #1   Title Ind with HEP.   Time 6   Period Weeks   Status Achieved   PT LONG TERM GOAL #2   Title Achieve full acive right knee extension to normalize gait.   Time 6   Period Weeks   Status Achieved   PT LONG TERM GOAL #3   Title Active right knee flexion to 115 degrees to increase function.   Time 6   Period Weeks   Status On-going   PT LONG TERM GOAL #4   Title 5/5 right knee strength to increase stability.   Time 6   Period Weeks   Status On-going   PT LONG TERM GOAL #5   Title Perform a reciprocating stair gait with one railing.   Time 6   Period Weeks   Status Achieved   PT LONG TERM GOAL #6   Title Walk a community distance without assistive device with pain not > 3/10.   Time 6   Period Weeks   Status On-going               Plan - 01/07/15 1341    Clinical Impression Statement Patient toerated treatment well today without complaint of pain. AROM of R knee measured as 0-107 deg today in clinic. Continues to require additional time to complete exercises due to tremors making intentions harder. Ambulated into therapy gym with [facility] which patient reports that she uses only if she is outside and was requested to ambulate between exercises without SPC. Ambulated without SPC as requested without significant gait deviation other than decreased speed. Normal modalties response noted following removal of the modalties. Denied pain following treatment.   Pt will benefit from skilled therapeutic intervention in order to improve on the following deficits Pain;Decreased activity tolerance;Decreased range of motion;Decreased strength   Rehab Potential Good   PT Frequency 3x / week   PT Duration 6 weeks   PT Treatment/Interventions ADLs/Self Care Home Management;Cryotherapy;Electrical Stimulation;Gait training;Stair training;Functional mobility training;Therapeutic exercise;Therapeutic  activities;Neuromuscular re-education;Patient/family education;Manual techniques;Vasopneumatic Device   PT Next Visit Plan Progress per MPT POC/ TKR protocol/ MD discretion.   Consulted and Agree with Plan of Care Patient        Problem List Patient Active Problem List   Diagnosis Date Noted  . Loose total knee arthroplasty 11/06/2014  . 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 Primary osteoarthritis of  knee 11/06/2014  . Osteoarthritis of right knee 03/30/2013    Evelene Croon, PTA 01/07/2015, 1:59 PM  [facility] [address] [place], [place], [address] Phone: [phone]   Fax:  [phone]

## 2015-01-09 ENCOUNTER — Ambulatory Visit: Payer: PRIVATE HEALTH INSURANCE | Admitting: Physical Therapy

## 2015-01-09 DIAGNOSIS — M25561 Pain in right knee: Secondary | ICD-10-CM

## 2015-01-09 DIAGNOSIS — M25661 Stiffness of right knee, not elsewhere classified: Secondary | ICD-10-CM

## 2015-01-09 NOTE — Therapy (Signed)
[facility] [address] [place], [place], [address] Phone: [phone]   Fax:  [phone]  Physical Therapy Treatment  Patient Details  Name: [name] MRN: [id number] Date of Birth: August 30, 1955 Referring Provider:  Samuel Jester, DO  Encounter Date: 01/09/2015      PT End of Session - 01/09/15 1240    Visit Number 16   Number of Visits 20   Date for PT Re-Evaluation 01/24/15   PT Start Time 1235   PT Stop Time 1335   PT Time Calculation (min) 60 min      Past Medical History  Diagnosis Date  . Bipolar disorder   . Migraine   . Depression   . GERD (gastroesophageal reflux disease)   . Arthritis   . Osteoporosis   . Hypothyroidism   . Pneumonia 1992  . Bruises easily     Past Surgical History  Procedure Laterality Date  . Appendectomy  1974  . Knee arthroscopy Right 1982  . Carpal tunnel release Right 1985  . Orif ankle fracture Left   . Dilation and curettage of uterus      x 3  . Colonoscopy    . Total knee arthroplasty Right 03/28/2013    Dr Cleophas Dunker  . Total knee arthroplasty Right 03/28/2013    Procedure: TOTAL KNEE ARTHROPLASTY;  Surgeon: Valeria Batman, MD;  Location: [facility] OR;  Service: Orthopedics;  Laterality: Right;  . Total knee revision Right 11/06/2014    Procedure: TOTAL KNEE REVISION;  Surgeon: Valeria Batman, MD;  Location: [facility] OR;  Service: Orthopedics;  Laterality: Right;    There were no vitals filed for this visit.  Visit Diagnosis:  Knee stiffness, right  Right knee pain      Subjective Assessment - 01/09/15 1241    Subjective no new complaints.    Currently in Pain? Yes   Pain Score 1    Pain Location Knee   Pain Orientation Right   Pain Descriptors / Indicators Sore   Pain Type Surgical pain                         [facility] Adult PT Treatment/Exercise - 01/09/15 0001    Knee/Hip Exercises: Aerobic   Stationary Bike L1 x10 min  moving up to seat 4   Knee/Hip Exercises: Machines for Strengthening   Cybex Knee Extension 20# 3 x10 reps   Cybex Leg Press 1 plate x 20    Knee/Hip Exercises: Standing   Forward Step Up Right;3 sets;10 reps;Hand Hold: 2;Step Height: 8"   Step Down Right;1 set;10 reps;Hand Hold: 2;Step Height: 8"   Programme researcher, broadcasting/film/video Location R knee   Electrical Stimulation Action IFC   Electrical Stimulation Parameters 1-10 Hz   Electrical Stimulation Goals Edema   Vasopneumatic   Number Minutes Vasopneumatic  15 minutes   Vasopnuematic Location  Knee   Vasopneumatic Pressure Medium   Vasopneumatic Temperature  3*   Manual Therapy   Manual Therapy Passive ROM   Soft tissue mobilization R patellar mobilizations in all directions in supine;    Passive ROM R knee into flex/ext with gentle holds at end range                     PT Long Term Goals - 01/07/15 1356    PT LONG TERM GOAL #1   Title Ind with HEP.   Time 6   Period  Weeks   Status Achieved   PT LONG TERM GOAL #2   Title Achieve full acive right knee extension to normalize gait.   Time 6   Period Weeks   Status Achieved   PT LONG TERM GOAL #3   Title Active right knee flexion to 115 degrees to increase function.   Time 6   Period Weeks   Status On-going   PT LONG TERM GOAL #4   Title 5/5 right knee strength to increase stability.   Time 6   Period Weeks   Status On-going   PT LONG TERM GOAL #5   Title Perform a reciprocating stair gait with one railing.   Time 6   Period Weeks   Status Achieved   PT LONG TERM GOAL #6   Title Walk a community distance without assistive device with pain not > 3/10.   Time 6   Period Weeks   Status On-going               Plan - 01/09/15 1412    Clinical Impression Statement Patient continues to tolerate increased therex without significant pain. She demonstrates functional weakness with step ups (lateral greater than forward) and requires VCs for good posture.  [facility] but does not use consistently in clinic.  She still has flexion ROM deficits affectiing ADLs.   PT Next Visit Plan Progress per MPT POC/ TKR protocol/ MD discretion.        Problem List Patient Active Problem List   Diagnosis Date Noted  . Loose total knee arthroplasty 11/06/2014  . 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 Primary osteoarthritis of knee 11/06/2014  . Osteoarthritis of right knee 03/30/2013    Solon PalmJulie Jamarius Saha PT  01/09/2015, 2:14 PM  [facility] [address] [place], [place], [address] Phone: [phone]   Fax:  [phone]

## 2015-01-10 ENCOUNTER — Ambulatory Visit: Payer: PRIVATE HEALTH INSURANCE | Admitting: Physical Therapy

## 2015-01-10 ENCOUNTER — Encounter: Payer: Self-pay | Admitting: Physical Therapy

## 2015-01-10 DIAGNOSIS — M25561 Pain in right knee: Secondary | ICD-10-CM | POA: Diagnosis not present

## 2015-01-10 DIAGNOSIS — R269 Unspecified abnormalities of gait and mobility: Secondary | ICD-10-CM

## 2015-01-10 DIAGNOSIS — M25661 Stiffness of right knee, not elsewhere classified: Secondary | ICD-10-CM

## 2015-01-10 NOTE — Therapy (Signed)
[facility] [address] [place], [place], [address] Phone: [phone]   Fax:  [phone]  Physical Therapy Treatment  Patient Details  Name: [name] MRN: [id number] Date of Birth: Feb 26, 1956 Referring Provider:  Samuel Jester, DO  Encounter Date: 01/10/2015      PT End of Session - 01/10/15 1306    Visit Number 17   Number of Visits 20   Date for PT Re-Evaluation 01/24/15   PT Start Time 1301   PT Stop Time 1352   PT Time Calculation (min) 51 min   Activity Tolerance Patient tolerated treatment well   Behavior During Therapy [facility] for tasks assessed/performed      Past Medical History  Diagnosis Date  . Bipolar disorder   . Migraine   . Depression   . GERD (gastroesophageal reflux disease)   . Arthritis   . Osteoporosis   . Hypothyroidism   . Pneumonia 1992  . Bruises easily     Past Surgical History  Procedure Laterality Date  . Appendectomy  1974  . Knee arthroscopy Right 1982  . Carpal tunnel release Right 1985  . Orif ankle fracture Left   . Dilation and curettage of uterus      x 3  . Colonoscopy    . Total knee arthroplasty Right 03/28/2013    Dr Cleophas Dunker  . Total knee arthroplasty Right 03/28/2013    Procedure: TOTAL KNEE ARTHROPLASTY;  Surgeon: Valeria Batman, MD;  Location: [facility] OR;  Service: Orthopedics;  Laterality: Right;  . Total knee revision Right 11/06/2014    Procedure: TOTAL KNEE REVISION;  Surgeon: Valeria Batman, MD;  Location: [facility] OR;  Service: Orthopedics;  Laterality: Right;    There were no vitals filed for this visit.  Visit Diagnosis:  Knee stiffness, right  Right knee pain  Abnormality of gait      Subjective Assessment - 01/10/15 1303    Subjective Had to drive her mother to various places yesterday following therapy and riding in car made her hurt and had 7-8/10 pain last night. Only uses cane when taking dog outside or feeding horses.   Limitations  Sitting;Standing;Walking   How long can you sit comfortably? 30-45 min   How long can you stand comfortably? 20 minutes.   How long can you walk comfortably? 20 minutes or more   Patient Stated Goals get out of pain and walk good again without assistance.   Currently in Pain? Yes   Pain Score 5    Pain Location Knee   Pain Orientation Right   Pain Descriptors / Indicators Other (Comment)  Stiffness   Pain Type Surgical pain            [facility] PT Assessment - 01/10/15 0001    Assessment   Medical Diagnosis S/p revision right total kne replacement.   Onset Date/Surgical Date 11/06/14   Next MD Visit 01/2015                     [facility] Adult PT Treatment/Exercise - 01/10/15 0001    Knee/Hip Exercises: Aerobic   Stationary Bike L1 x8 min   Knee/Hip Exercises: Machines for Strengthening   Cybex Knee Extension 20# 3 x10 reps   Knee/Hip Exercises: Standing   Forward Lunges Right;2 sets;10 reps;3 seconds;Other (comment)  14" box   Forward Step Up Right;3 sets;10 reps;Hand Hold: 2;Step Height: 6"   Step Down Right;3 sets;10 reps;Hand Hold: 2;Step Height: 4"   Rocker  Board 3 minutes   Modalities   Modalities Office manager Location R knee   Electrical Stimulation Action IFC   Electrical Stimulation Parameters 1-10 Hz x15 min   Electrical Stimulation Goals Pain;Edema   Vasopneumatic   Number Minutes Vasopneumatic  15 minutes   Vasopnuematic Location  Knee   Vasopneumatic Pressure Medium   Vasopneumatic Temperature  67   Manual Therapy   Manual Therapy Passive ROM;Soft tissue mobilization   Soft tissue mobilization R patellar mobilizations in all directions in supine;    Passive ROM R knee into flex/ext with gentle holds at end range                     PT Long Term Goals - 01/07/15 1356    PT LONG TERM GOAL #1   Title Ind with HEP.   Time 6   Period Weeks   Status Achieved    PT LONG TERM GOAL #2   Title Achieve full acive right knee extension to normalize gait.   Time 6   Period Weeks   Status Achieved   PT LONG TERM GOAL #3   Title Active right knee flexion to 115 degrees to increase function.   Time 6   Period Weeks   Status On-going   PT LONG TERM GOAL #4   Title 5/5 right knee strength to increase stability.   Time 6   Period Weeks   Status On-going   PT LONG TERM GOAL #5   Title Perform a reciprocating stair gait with one railing.   Time 6   Period Weeks   Status Achieved   PT LONG TERM GOAL #6   Title Walk a community distance without assistive device with pain not > 3/10.   Time 6   Period Weeks   Status On-going               Plan - 01/10/15 1340    Clinical Impression Statement Patient continues to do well during treatment with all exercises completed without complaint of pain. Ambulated into therapy gym without assistive device today with minimal gait deviation. Firm end feel noted during PROM of R knee into flexion. Normal modalities response noted following removal of the modalities. Experienced 2/10 pain following treatment.   Pt will benefit from skilled therapeutic intervention in order to improve on the following deficits Pain;Decreased activity tolerance;Decreased range of motion;Decreased strength   Rehab Potential Good   PT Frequency 3x / week   PT Duration 6 weeks   PT Treatment/Interventions ADLs/Self Care Home Management;Cryotherapy;Electrical Stimulation;Gait training;Stair training;Functional mobility training;Therapeutic exercise;Therapeutic activities;Neuromuscular re-education;Patient/family education;Manual techniques;Vasopneumatic Device   PT Next Visit Plan Progress per MPT POC/ TKR protocol/ MD discretion.   Consulted and Agree with Plan of Care Patient        Problem List Patient Active Problem List   Diagnosis Date Noted  . Loose total knee arthroplasty 11/06/2014  . Mechanical loosening of internal  right knee prosthetic joint 11/06/2014  . Primary osteoarthritis of knee 11/06/2014  . Osteoarthritis of right knee 03/30/2013    Evelene Croon, PTA 01/10/2015, 1:56 PM  [facility] [address] [place], [place], [address] Phone: [phone]   Fax:  [phone]

## 2015-01-15 ENCOUNTER — Encounter: Payer: PRIVATE HEALTH INSURANCE | Admitting: Physical Therapy

## 2015-01-16 ENCOUNTER — Ambulatory Visit: Payer: PRIVATE HEALTH INSURANCE | Admitting: Physical Therapy

## 2015-01-16 ENCOUNTER — Encounter: Payer: Self-pay | Admitting: Physical Therapy

## 2015-01-16 DIAGNOSIS — M25561 Pain in right knee: Secondary | ICD-10-CM

## 2015-01-16 DIAGNOSIS — M25661 Stiffness of right knee, not elsewhere classified: Secondary | ICD-10-CM

## 2015-01-16 DIAGNOSIS — R269 Unspecified abnormalities of gait and mobility: Secondary | ICD-10-CM

## 2015-01-16 NOTE — Therapy (Signed)
[facility] [address] [place], [place], [address] Phone: [phone]   Fax:  [phone]  Physical Therapy Treatment  Patient Details  Name: [name] MRN: [id number] Date of Birth: June 18, 1956 Referring Provider:  Samuel Jester, DO  Encounter Date: 01/16/2015      PT End of Session - 01/16/15 1112    Visit Number 18   Number of Visits 20   Date for PT Re-Evaluation 01/24/15   PT Start Time 1031   PT Stop Time 1129   PT Time Calculation (min) 58 min   Activity Tolerance Patient tolerated treatment well   Behavior During Therapy [facility] for tasks assessed/performed      Past Medical History  Diagnosis Date  . Bipolar disorder   . Migraine   . Depression   . GERD (gastroesophageal reflux disease)   . Arthritis   . Osteoporosis   . Hypothyroidism   . Pneumonia 1992  . Bruises easily     Past Surgical History  Procedure Laterality Date  . Appendectomy  1974  . Knee arthroscopy Right 1982  . Carpal tunnel release Right 1985  . Orif ankle fracture Left   . Dilation and curettage of uterus      x 3  . Colonoscopy    . Total knee arthroplasty Right 03/28/2013    Dr Cleophas Dunker  . Total knee arthroplasty Right 03/28/2013    Procedure: TOTAL KNEE ARTHROPLASTY;  Surgeon: Valeria Batman, MD;  Location: [facility] OR;  Service: Orthopedics;  Laterality: Right;  . Total knee revision Right 11/06/2014    Procedure: TOTAL KNEE REVISION;  Surgeon: Valeria Batman, MD;  Location: [facility] OR;  Service: Orthopedics;  Laterality: Right;    There were no vitals filed for this visit.  Visit Diagnosis:  Knee stiffness, right  Right knee pain  Abnormality of gait      Subjective Assessment - 01/16/15 1036    Subjective walked a lot with some increased pain, yet better today   Limitations Sitting;Standing;Walking   How long can you sit comfortably? 30-45 min   How long can you stand comfortably? 20 minutes.   How long can you walk  comfortably? 20 minutes or more   Patient Stated Goals get out of pain and walk good again without assistance.   Currently in Pain? Yes   Pain Score 2    Pain Location Knee   Pain Orientation Right   Pain Descriptors / Indicators Sore   Pain Type Surgical pain   Pain Frequency Intermittent   Aggravating Factors  prolong walking   Pain Relieving Factors rest            [facility] PT Assessment - 01/16/15 0001    ROM / Strength   AROM / PROM / Strength AROM;PROM   AROM   Overall AROM  Deficits   AROM Assessment Site Knee   Right/Left Knee Right   Right Knee Extension -3   Right Knee Flexion 104   PROM   Overall PROM  Deficits   Overall PROM Comments -9 - 110   PROM Assessment Site Knee   Right/Left Knee Right                     [facility] Adult PT Treatment/Exercise - 01/16/15 0001    Knee/Hip Exercises: Aerobic   Stationary Bike L1 x10 min   Knee/Hip Exercises: Machines for Strengthening   Cybex Knee Extension 20# 3 x10 reps   Knee/Hip Exercises:  Standing   Forward Step Up Right;3 sets;10 reps;Hand Hold: 2;Step Height: 6"   Step Down Right;3 sets;10 reps;Hand Hold: 2;Step Height: 4"   Programme researcher, broadcasting/film/video Location R knee   Electrical Stimulation Action IFC   Electrical Stimulation Parameters 1-10Hz    Electrical Stimulation Goals Pain   Vasopneumatic   Number Minutes Vasopneumatic  15 minutes   Vasopnuematic Location  Knee   Vasopneumatic Pressure Medium   Manual Therapy   Manual Therapy Passive ROM   Passive ROM R knee into flex/ext with gentle holds at end range                     PT Long Term Goals - 01/07/15 1356    PT LONG TERM GOAL #1   Title Ind with HEP.   Time 6   Period Weeks   Status Achieved   PT LONG TERM GOAL #2   Title Achieve full acive right knee extension to normalize gait.   Time 6   Period Weeks   Status Achieved   PT LONG TERM GOAL #3   Title Active right knee flexion to 115 degrees to  increase function.   Time 6   Period Weeks   Status On-going   PT LONG TERM GOAL #4   Title 5/5 right knee strength to increase stability.   Time 6   Period Weeks   Status On-going   PT LONG TERM GOAL #5   Title Perform a reciprocating stair gait with one railing.   Time 6   Period Weeks   Status Achieved   PT LONG TERM GOAL #6   Title Walk a community distance without assistive device with pain not > 3/10.   Time 6   Period Weeks   Status On-going               Plan - 01/16/15 1113    Clinical Impression Statement Patient progressing with all activities slowly, has some pain after prolong walking and days off stiffness in right knee. Patient unable to walk a community distance with less than 3/10 pain. Patient unable to meet any further goals today due to ROM and pain deficits.   Pt will benefit from skilled therapeutic intervention in order to improve on the following deficits Pain;Decreased activity tolerance;Decreased range of motion;Decreased strength   Rehab Potential Good   PT Frequency 3x / week   PT Duration 6 weeks   PT Treatment/Interventions ADLs/Self Care Home Management;Cryotherapy;Electrical Stimulation;Gait training;Stair training;Functional mobility training;Therapeutic exercise;Therapeutic activities;Neuromuscular re-education;Patient/family education;Manual techniques;Vasopneumatic Device   PT Next Visit Plan Progress per MPT POC/ TKR protocol/ MD discretion. (MD. whitfield aug 3)   Consulted and Agree with Plan of Care Patient        Problem List Patient Active Problem List   Diagnosis Date Noted  . Loose total knee arthroplasty 11/06/2014  . 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 Primary osteoarthritis of knee 11/06/2014  . Osteoarthritis of right knee 03/30/2013    Ronzell Laban P, PTA 01/16/2015, 11:28 AM  [facility] [address] [place], [place],  [address] Phone: [phone]   Fax:  [phone]

## 2015-01-17 ENCOUNTER — Ambulatory Visit: Payer: PRIVATE HEALTH INSURANCE | Admitting: Physical Therapy

## 2015-01-17 ENCOUNTER — Encounter: Payer: Self-pay | Admitting: Physical Therapy

## 2015-01-17 DIAGNOSIS — M25561 Pain in right knee: Secondary | ICD-10-CM

## 2015-01-17 DIAGNOSIS — M25661 Stiffness of right knee, not elsewhere classified: Secondary | ICD-10-CM

## 2015-01-17 DIAGNOSIS — R269 Unspecified abnormalities of gait and mobility: Secondary | ICD-10-CM

## 2015-01-17 NOTE — Therapy (Signed)
[facility] [place], [place], [address] Phone: [phone]   Fax:  [phone]  Physical Therapy Treatment  Patient Details  Name: [name] MRN: [id number] Date of Birth: [birth year] Referring Provider:  Octavio Graves, DO  Encounter Date: 01/17/2015      PT End of Session - 01/17/15 1302    Visit Number 19   Number of Visits 20   Date for PT Re-Evaluation 01/24/15   PT Start Time 1300   PT Stop Time 1353   PT Time Calculation (min) 53 min   Activity Tolerance Patient tolerated treatment well   Behavior During Therapy [facility] for tasks assessed/performed      Past Medical History  Diagnosis Date  . Bipolar disorder   . Migraine   . Depression   . GERD (gastroesophageal reflux disease)   . Arthritis   . Osteoporosis   . Hypothyroidism   . Pneumonia 1992  . Bruises easily     Past Surgical History  Procedure Laterality Date  . Appendectomy  1974  . Knee arthroscopy Right 1982  . Carpal tunnel release Right 1985  . Orif ankle fracture Left   . Dilation and curettage of uterus      x 3  . Colonoscopy    . Total knee arthroplasty Right 03/28/2013    Dr Durward Fortes  . Total knee arthroplasty Right 03/28/2013    Procedure: TOTAL KNEE ARTHROPLASTY;  Surgeon: Garald Balding, MD;  Location: [facility];  Service: Orthopedics;  Laterality: Right;  . Total knee revision Right 11/06/2014    Procedure: TOTAL KNEE REVISION;  Surgeon: Garald Balding, MD;  Location: [facility];  Service: Orthopedics;  Laterality: Right;    There were no vitals filed for this visit.  Visit Diagnosis:  Knee stiffness, right  Right knee pain  Abnormality of gait      Subjective Assessment - 01/17/15 1301    Subjective Reports that driving hurts knee worst than anything.   Limitations Sitting;Standing;Walking   How long can you sit comfortably? 30-45 min   How long can you stand comfortably? 20 minutes.   How long can you walk  comfortably? 20 minutes or more   Patient Stated Goals get out of pain and walk good again without assistance.   Currently in Pain? No/denies            [facility] PT Assessment - 01/17/15 0001    Assessment   Medical Diagnosis S/p revision right total kne replacement.   Onset Date/Surgical Date 11/06/14   Next MD Visit 01/2015   ROM / Strength   AROM / PROM / Strength AROM;Strength   AROM   Right Knee Flexion 107   Strength   Strength Assessment Site Knee   Right/Left Knee Right   Right Knee Flexion 4/5   Right Knee Extension 4+/5                     [facility] Adult PT Treatment/Exercise - 01/17/15 0001    Knee/Hip Exercises: Aerobic   Stationary Bike L1 x10 min   Knee/Hip Exercises: Machines for Strengthening   Cybex Knee Extension 20# 3 x10 reps   Knee/Hip Exercises: Standing   Lateral Step Up Right;3 sets;10 reps;Step Height: 6"   Forward Step Up Right;3 sets;10 reps;Hand Hold: 2;Step Height: 6"   Step Down Right;3 sets;10 reps;Hand Hold: 2;Step Height: 4"   Modalities   Modalities Network engineer  Stimulation Location R knee   Electrical Stimulation Action IFC   Electrical Stimulation Parameters 1-10 Hz x15 min   Electrical Stimulation Goals Pain   Vasopneumatic   Number Minutes Vasopneumatic  15 minutes   Vasopnuematic Location  Knee   Vasopneumatic Pressure Medium   Vasopneumatic Temperature  34   Manual Therapy   Manual Therapy Passive ROM   Soft tissue mobilization R patellar mobilizations in all directions in supine;    Passive ROM R knee into flex/ext with gentle holds at end range                     PT Long Term Goals - 01/17/15 1309    PT LONG TERM GOAL #1   Title Ind with HEP.   Time 6   Period Weeks   Status Achieved   PT LONG TERM GOAL #2   Title Achieve full acive right knee extension to normalize gait.   Time 6   Period Weeks   Status Achieved   PT LONG TERM  GOAL #3   Title Active right knee flexion to 115 degrees to increase function.   Time 6   Period Weeks   Status Not Met  AROM R knee 107 deg 01/17/2015   PT LONG TERM GOAL #4   Title 5/5 right knee strength to increase stability.   Time 6   Period Weeks   Status Not Met  01/17/2015 4/5 R knee flexors, 4+/5 R knee extensors   PT LONG TERM GOAL #5   Title Perform a reciprocating stair gait with one railing.   Time 6   Period Weeks   Status Achieved   PT LONG TERM GOAL #6   Title Walk a community distance without assistive device with pain not > 3/10.   Time 6   Period Weeks   Status Achieved               Plan - 01/17/15 1340    Clinical Impression Statement Patient did well in therapy and has progressed since the last surgery. Has achieved all goals set at evaluation except for R knee strength of 5/5 and R knee flexion to 115 deg. AROM of R knee measured today as 0-107 deg. Demonstrates good R patellar mobility today in clinic. Returns to work 01/28/2015. Normal modalities response noted following removal of the modalities. Denied pain following treatment.   Pt will benefit from skilled therapeutic intervention in order to improve on the following deficits Pain;Decreased activity tolerance;Decreased range of motion;Decreased strength   Rehab Potential Good   PT Frequency 3x / week   PT Duration 6 weeks   PT Treatment/Interventions ADLs/Self Care Home Management;Cryotherapy;Electrical Stimulation;Gait training;Stair training;Functional mobility training;Therapeutic exercise;Therapeutic activities;Neuromuscular re-education;Patient/family education;Manual techniques;Vasopneumatic Device   PT Next Visit Plan Communicate with MPT regarding need for D/C summary. Sees Dr. Durward Fortes 01/23/2015.   Consulted and Agree with Plan of Care Patient        Problem List Patient Active Problem List   Diagnosis Date Noted  . Loose total knee arthroplasty 11/06/2014  . 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 Primary osteoarthritis of knee 11/06/2014  . Osteoarthritis of right knee 03/30/2013    Ahmed Prima, PTA 01/17/2015 1:57 PM  [facility] [place], [place], [address] Phone: [phone]   Fax:  [phone]

## 2015-01-18 NOTE — Therapy (Signed)
[facility] [place], [place], [address] Phone: [phone]   Fax:  [phone]  Physical Therapy Treatment  Patient Details  Name: [name] MRN: [id number] Date of Birth: 05/26/1956 Referring Provider:  Octavio Graves, DO  Encounter Date: 01/17/2015      PT End of Session - 01/17/15 1302    Visit Number 19   Number of Visits 20   Date for PT Re-Evaluation 01/24/15   PT Start Time 1300   PT Stop Time 1353   PT Time Calculation (min) 53 min   Activity Tolerance Patient tolerated treatment well   Behavior During Therapy [facility] for tasks assessed/performed      Past Medical History  Diagnosis Date  . Bipolar disorder   . Migraine   . Depression   . GERD (gastroesophageal reflux disease)   . Arthritis   . Osteoporosis   . Hypothyroidism   . Pneumonia 1992  . Bruises easily     Past Surgical History  Procedure Laterality Date  . Appendectomy  1974  . Knee arthroscopy Right 1982  . Carpal tunnel release Right 1985  . Orif ankle fracture Left   . Dilation and curettage of uterus      x 3  . Colonoscopy    . Total knee arthroplasty Right 03/28/2013    Dr Durward Fortes  . Total knee arthroplasty Right 03/28/2013    Procedure: TOTAL KNEE ARTHROPLASTY;  Surgeon: Garald Balding, MD;  Location: [facility];  Service: Orthopedics;  Laterality: Right;  . Total knee revision Right 11/06/2014    Procedure: TOTAL KNEE REVISION;  Surgeon: Garald Balding, MD;  Location: [facility] [place];  Service: Orthopedics;  Laterality: Right;    There were no vitals filed for this visit.  Visit Diagnosis:  Knee stiffness, right  Right knee pain  Abnormality of gait      Subjective Assessment - 01/17/15 1301    Subjective Reports that driving hurts knee worst than anything.   Limitations Sitting;Standing;Walking   How long can you sit comfortably? 30-45 min   How long can you stand comfortably? 20 minutes.   How long can you walk  comfortably? 20 minutes or more   Patient Stated Goals get out of pain and walk good again without assistance.   Currently in Pain? No/denies                                      PT Long Term Goals - 01/17/15 1309    PT LONG TERM GOAL #1   Title Ind with HEP.   Time 6   Period Weeks   Status Achieved   PT LONG TERM GOAL #2   Title Achieve full acive right knee extension to normalize gait.   Time 6   Period Weeks   Status Achieved   PT LONG TERM GOAL #3   Title Active right knee flexion to 115 degrees to increase function.   Time 6   Period Weeks   Status Not Met  AROM R knee 107 deg 01/17/2015   PT LONG TERM GOAL #4   Title 5/5 right knee strength to increase stability.   Time 6   Period Weeks   Status Not Met  01/17/2015 4/5 R knee flexors, 4+/5 R knee extensors   PT LONG TERM GOAL #5   Title Perform a reciprocating stair gait with one railing.  Time 6   Period Weeks   Status Achieved   PT LONG TERM GOAL #6   Title Walk a community distance without assistive device with pain not > 3/10.   Time 6   Period Weeks   Status Achieved               Plan - 01/17/15 1340    Clinical Impression Statement Patient did well in therapy and has progressed since the last surgery. Has achieved all goals set at evaluation except for R knee strength of 5/5 and R knee flexion to 115 deg. AROM of R knee measured today as 0-107 deg. Demonstrates good R patellar mobility today in clinic. Returns to work 01/28/2015. Normal modalities response noted following removal of the modalities. Denied pain following treatment.   Pt will benefit from skilled therapeutic intervention in order to improve on the following deficits Pain;Decreased activity tolerance;Decreased range of motion;Decreased strength   Rehab Potential Good   PT Frequency 3x / week   PT Duration 6 weeks   PT Treatment/Interventions ADLs/Self Care Home Management;Cryotherapy;Electrical  Stimulation;Gait training;Stair training;Functional mobility training;Therapeutic exercise;Therapeutic activities;Neuromuscular re-education;Patient/family education;Manual techniques;Vasopneumatic Device   PT Next Visit Plan Communicate with MPT regarding need for D/C summary. Sees Dr. Durward Fortes 01/23/2015.   Consulted and Agree with Plan of Care Patient        Problem List Patient Active Problem List   Diagnosis Date Noted  . Loose total knee arthroplasty 11/06/2014  . 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 Primary osteoarthritis of knee 11/06/2014  . Osteoarthritis of right knee 03/30/2013   PHYSICAL THERAPY DISCHARGE SUMMARY  Visits from Start of Care: 19  Current functional level related to goals / functional outcomes: Please see above.   Remaining deficits: Goals #2 and 3 not quite met.   Education / Equipment: HEP Plan: Patient agrees to discharge.  Patient goals were partially met. Patient is being discharged due to the patient's request.  ?????      Dorean Daniello, [place] MPT 01/18/2015, 10:25 AM  [facility] [place], [place], [address] Phone: [phone]   Fax:  [phone]

## 2015-08-21 NOTE — Therapy (Signed)
[facility] [place], [place], [address] Phone: [phone]   Fax:  [phone]  Physical Therapy Treatment  Patient Details  Name: AMA MCMASTER MRN: [id number] Date of Birth: 12/20/1955 No Data Recorded  Encounter Date: 01/17/2015    Past Medical History  Diagnosis Date  . Bipolar disorder   . Migraine   . Depression   . GERD (gastroesophageal reflux disease)   . Arthritis   . Osteoporosis   . Hypothyroidism   . Pneumonia 1992  . Bruises easily     Past Surgical History  Procedure Laterality Date  . Appendectomy  1974  . Knee arthroscopy Right 1982  . Carpal tunnel release Right 1985  . Orif ankle fracture Left   . Dilation and curettage of uterus      x 3  . Colonoscopy    . Total knee arthroplasty Right 03/28/2013    Dr Durward Fortes  . Total knee arthroplasty Right 03/28/2013    Procedure: TOTAL KNEE ARTHROPLASTY;  Surgeon: Garald Balding, MD;  Location: [facility];  Service: Orthopedics;  Laterality: Right;  . Total knee revision Right 11/06/2014    Procedure: TOTAL KNEE REVISION;  Surgeon: Garald Balding, MD;  Location: [facility];  Service: Orthopedics;  Laterality: Right;    There were no vitals filed for this visit.  Visit Diagnosis:  Knee stiffness, right  Right knee pain  Abnormality of gait                                    PT Long Term Goals - 01/17/15 1309    PT LONG TERM GOAL #1   Title Ind with HEP.   Time 6   Period Weeks   Status Achieved   PT LONG TERM GOAL #2   Title Achieve full acive right knee extension to normalize gait.   Time 6   Period Weeks   Status Achieved   PT LONG TERM GOAL #3   Title Active right knee flexion to 115 degrees to increase function.   Time 6   Period Weeks   Status Not Met  AROM R knee 107 deg 01/17/2015   PT LONG TERM GOAL #4   Title 5/5 right knee strength to increase stability.   Time 6   Period Weeks   Status Not  Met  01/17/2015 4/5 R knee flexors, 4+/5 R knee extensors   PT LONG TERM GOAL #5   Title Perform a reciprocating stair gait with one railing.   Time 6   Period Weeks   Status Achieved   PT LONG TERM GOAL #6   Title Walk a community distance without assistive device with pain not > 3/10.   Time 6   Period Weeks   Status Achieved               Problem List Patient Active Problem List   Diagnosis Date Noted  . Loose total knee arthroplasty ([place]) 11/06/2014  . 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 Primary osteoarthritis of knee 11/06/2014  . Osteoarthritis of right knee 03/30/2013   PHYSICAL THERAPY DISCHARGE SUMMARY  Visits from Start of Care: 19  Current functional level related to goals / functional outcomes: Please see above.   Remaining deficits: See goals #3 and #4 above.   Education / Equipment: HEP. Plan: Patient agrees to discharge.  Patient goals  were partially met. Patient is being discharged due to being pleased with the current functional level.  ?????      Tamaya Pun, [place] MPT 08/21/2015, 5:43 PM  [facility] [address] [place], [place], [address] Phone: [phone]   Fax:  [phone]  Name: ZAYDAH NAWABI MRN: [id number] Date of Birth: 18-May-1956

## 2016-03-31 ENCOUNTER — Other Ambulatory Visit: Payer: Self-pay | Admitting: *Deleted

## 2016-03-31 ENCOUNTER — Telehealth: Payer: Self-pay | Admitting: Physician Assistant

## 2016-03-31 MED ORDER — ALPRAZOLAM 0.5 MG PO TBDP: 0.5000 mg | ORAL_TABLET | Freq: Three times a day (TID) | ORAL | 0 refills | Status: AC | PRN

## 2016-03-31 NOTE — Telephone Encounter (Signed)
Call in 

## 2016-03-31 NOTE — Telephone Encounter (Signed)
Last filled   12/19/15

## 2016-06-01 IMAGING — CR DG CHEST 2V
2 series · 2 of 2 positions shown · non-contrast
Comparison: None.

CLINICAL DATA: Preop right knee surgery

EXAM:
CHEST  2 VIEW

[w chest pa]
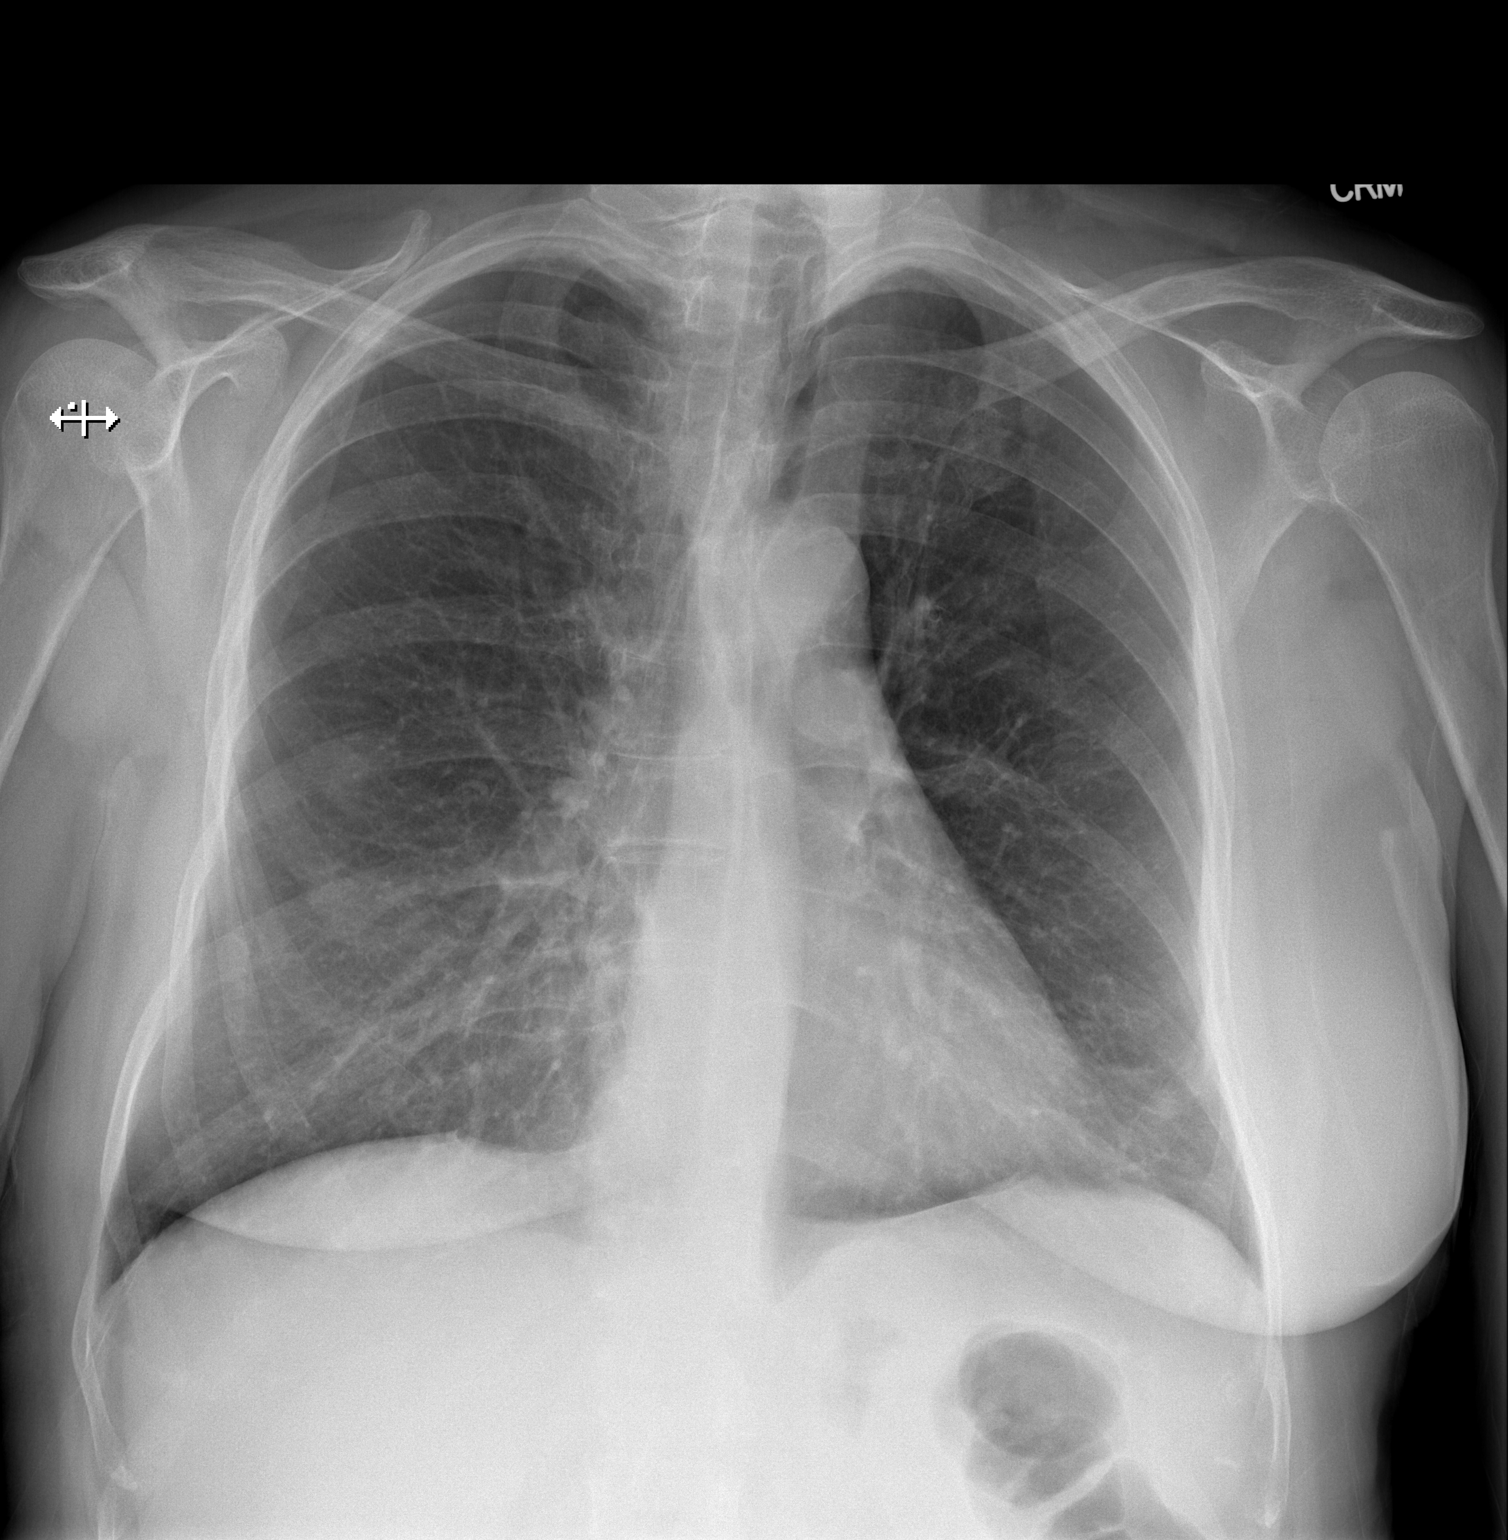

[w chest lat]
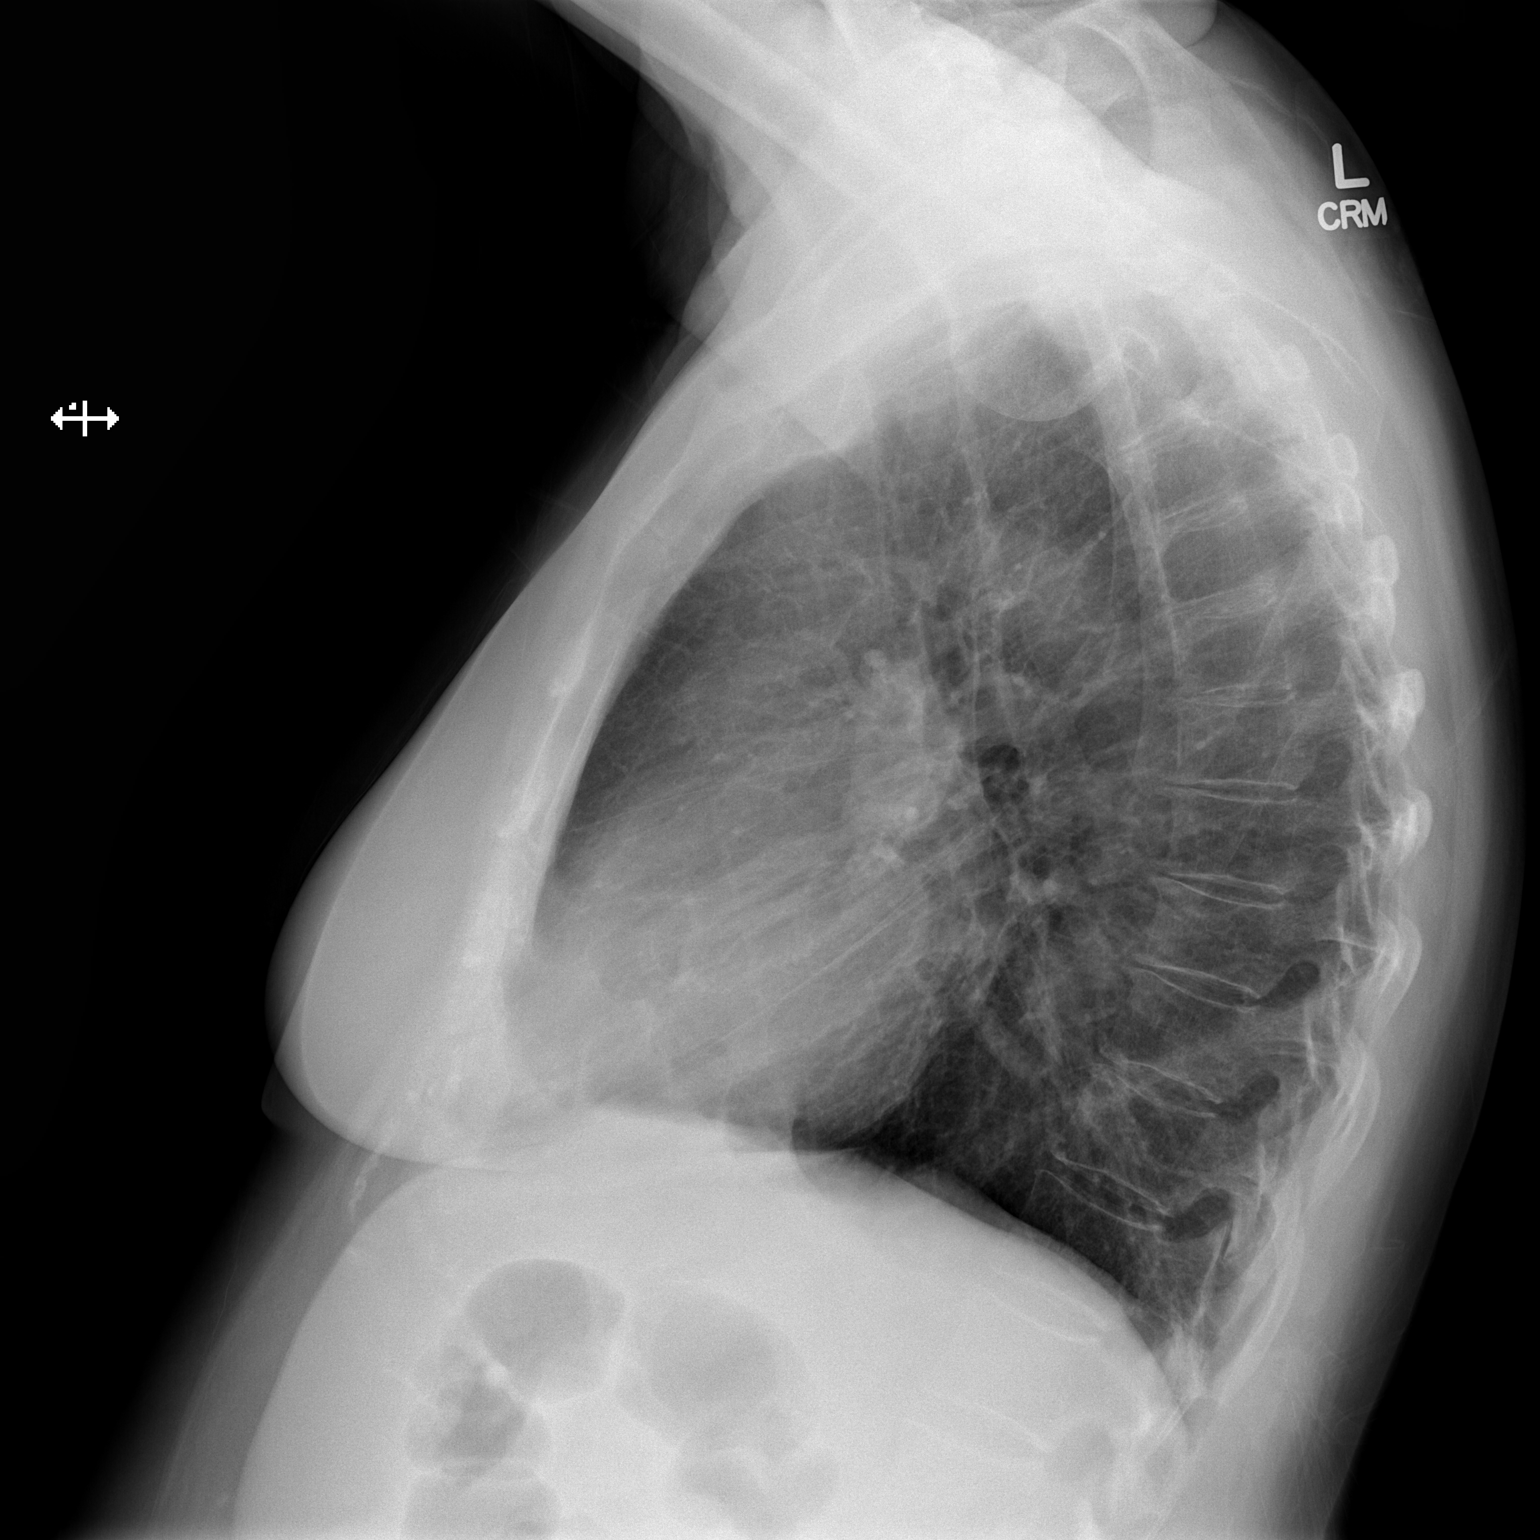

[2 of 2 positions shown; findings below may reference images not displayed]

FINDINGS: The heart size and mediastinal contours are within normal limits.
Both lungs are clear. The visualized skeletal structures are
unremarkable.
IMPRESSION: No active cardiopulmonary disease.

## 2016-12-07 ENCOUNTER — Other Ambulatory Visit: Payer: Self-pay | Admitting: Physician Assistant

## 2017-03-10 ENCOUNTER — Ambulatory Visit (INDEPENDENT_AMBULATORY_CARE_PROVIDER_SITE_OTHER): Payer: BLUE CROSS/BLUE SHIELD | Admitting: Orthopaedic Surgery

## 2017-03-10 ENCOUNTER — Encounter (INDEPENDENT_AMBULATORY_CARE_PROVIDER_SITE_OTHER): Payer: Self-pay | Admitting: Orthopaedic Surgery

## 2017-03-10 VITALS — BP 115/65 | HR 65 | Resp 14 | Ht 64.0 in | Wt 151.0 lb

## 2017-03-10 DIAGNOSIS — M25571 Pain in right ankle and joints of right foot: Secondary | ICD-10-CM | POA: Diagnosis not present

## 2017-03-10 DIAGNOSIS — M2141 Flat foot [pes planus] (acquired), right foot: Secondary | ICD-10-CM

## 2017-03-10 NOTE — Progress Notes (Signed)
Office Visit Note   Patient: [name]           Date of Birth: 1955-08-24           MRN: [id number] Visit Date: 03/10/2017              Requested by: Samuel Jester, DO [address] [place], [place] [address] PCP: Samuel Jester, DO   Assessment & Plan: Visit Diagnoses:  1. 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 No impingement symptoms laterally. We will try ankle support with built-in arch. Discussed follow-up MRI scan if no improvement over the next 6 weeks  Follow-Up Instructions: Return if symptoms worsen or fail to improve.   Orders:  No orders of the defined types were placed in this encounter.  No orders of the defined types were placed in this encounter.     Procedures: No procedures performed   Clinical Data: No additional findings.   Subjective: Chief Complaint  Patient presents with  . Right Ankle - Pain, Edema    [name] is a 61 y o that presents with Right ankle and swelling since June 1st.   Insidious onset of medial right ankle pain and foot discomfort over the last several months. No history of injury or trauma. No fever or chills or skin changes. Has had some mild edema of both ankles. Pain seems to increase depending upon length of time on feet. No pain laterally. No calf pain. No back discomfort. No numbness or tingling HPI  Review of Systems  Constitutional: Negative for chills, fatigue and fever.  Eyes: Negative for itching.  Respiratory: Negative for chest tightness and shortness of breath.   Cardiovascular: Negative for chest pain, palpitations and leg swelling.  Gastrointestinal: Negative for blood in stool, constipation and diarrhea.  Endocrine: Negative for polyuria.  Genitourinary: Negative for dysuria.  Musculoskeletal: Negative for back pain, joint swelling, neck pain and neck  stiffness.  Allergic/Immunologic: Negative for immunocompromised state.  Neurological: Positive for tremors. Negative for dizziness and numbness.  Hematological: Does not bruise/bleed easily.  Psychiatric/Behavioral: The patient is not nervous/anxious.      Objective: Vital Signs: BP 115/65   Pulse 65   Resp 14   Ht  (1.626 m)   Wt 151 lb (68.5 kg)   BMI 25.92 kg/m   Physical Exam  Ortho Examright foot exam with evidence of pes planus.No spasticity. Skin intact neurovascular exam intact. Pain along the posterior medial malleolus in distribution of posterior tibial tendon. No inversion of os calcis with standing on toes No Pain laterally .No pain over deltoid ligament or lateral ligaments.  Specialty Comments:  No specialty comments available.  Imaging: No results found.   PMFS History: Patient Active Problem List   Diagnosis Date Noted  . Loose total knee arthroplasty ([facility]) 11/06/2014  . 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 Primary osteoarthritis of knee 11/06/2014  . Osteoarthritis of right knee 03/30/2013   Past Medical History:  Diagnosis Date  . Arthritis   . Bipolar disorder ([facility])   . Bruises easily   . Depression   . GERD (gastroesophageal reflux disease)   . Hypothyroidism   . Migraine   . Osteoporosis   . Pneumonia 1992    No family history on file.  Past Surgical History:  Procedure Laterality Date  .  APPENDECTOMY  1974  . CARPAL TUNNEL RELEASE Right 1985  . COLONOSCOPY    . DILATION AND CURETTAGE OF UTERUS     x 3  . KNEE ARTHROSCOPY Right 1982  . ORIF ANKLE FRACTURE Left   . TOTAL KNEE ARTHROPLASTY Right 03/28/2013   Dr Cleophas Dunker  . TOTAL KNEE ARTHROPLASTY Right 03/28/2013   Procedure: TOTAL KNEE ARTHROPLASTY;  Surgeon: Valeria Batman, MD;  Location: [facility] OR;  Service: Orthopedics;  Laterality: Right;  . TOTAL KNEE REVISION Right 11/06/2014   Procedure: TOTAL KNEE REVISION;  Surgeon: Valeria Batman, MD;  Location: [facility] OR;  Service: Orthopedics;  Laterality: Right;   Social History   Occupational History  . Not on file.   Social History Main Topics  . Smoking status: Current Every Day Smoker    Packs/day: 1.00    Years: 40.00    Types: Cigarettes    Last attempt to quit: 02/20/2013  . Smokeless tobacco: Never Used  . Alcohol use No  . Drug use: No  . Sexual activity: Not on file

## 2018-12-06 ENCOUNTER — Other Ambulatory Visit: Payer: PRIVATE HEALTH INSURANCE

## 2018-12-06 ENCOUNTER — Other Ambulatory Visit: Payer: Self-pay

## 2018-12-06 DIAGNOSIS — Z20822 Contact with and (suspected) exposure to covid-19: Secondary | ICD-10-CM

## 2018-12-09 LAB — NOVEL CORONAVIRUS, NAA: SARS-CoV-2, NAA: NOT DETECTED

## 2019-01-21 DEATH — deceased
